# Patient Record
Sex: Male | Born: 1960 | Race: Black or African American | Hispanic: No | Marital: Single | Smoking: Never smoker
Health system: Southern US, Community
[De-identification: ages and names within clinical notes are randomized; demographics above are authoritative.]

## PROBLEM LIST (undated history)

## (undated) DIAGNOSIS — F32A Depression, unspecified: Secondary | ICD-10-CM

## (undated) DIAGNOSIS — F329 Major depressive disorder, single episode, unspecified: Secondary | ICD-10-CM

## (undated) DIAGNOSIS — E119 Type 2 diabetes mellitus without complications: Secondary | ICD-10-CM

## (undated) HISTORY — PX: ARTERY REPAIR: SHX559

---

## 2018-11-19 ENCOUNTER — Emergency Department (HOSPITAL_COMMUNITY)
Admission: EM | Admit: 2018-11-19 | Discharge: 2018-11-19 | Disposition: A | Discharge status: Home / Self Care | Payer: Medicaid Other | Attending: Emergency Medicine | Admitting: Emergency Medicine

## 2018-11-19 ENCOUNTER — Encounter (HOSPITAL_COMMUNITY): Payer: Self-pay | Admitting: Emergency Medicine

## 2018-11-19 ENCOUNTER — Other Ambulatory Visit: Payer: Self-pay

## 2018-11-19 DIAGNOSIS — Z760 Encounter for issue of repeat prescription: Secondary | ICD-10-CM | POA: Insufficient documentation

## 2018-11-19 DIAGNOSIS — E119 Type 2 diabetes mellitus without complications: Secondary | ICD-10-CM | POA: Insufficient documentation

## 2018-11-19 HISTORY — DX: Major depressive disorder, single episode, unspecified: F32.9

## 2018-11-19 HISTORY — DX: Depression, unspecified: F32.A

## 2018-11-19 HISTORY — DX: Type 2 diabetes mellitus without complications: E11.9

## 2018-11-19 LAB — CBG MONITORING, ED: Glucose-Capillary: 291 mg/dL — ABNORMAL HIGH (ref 70–99)

## 2018-11-19 MED ORDER — GLIPIZIDE 10 MG PO TABS: 10.0000 mg | ORAL_TABLET | Freq: Two times a day (BID) | ORAL | 0 refills | Status: DC

## 2018-11-19 NOTE — ED Triage Notes (Signed)
Pt reports being out of his glipizide since Thursday, he states "I don't have a primary care." He was seen last in [place] and given medication. Denies any other complaints.

## 2018-11-19 NOTE — Discharge Instructions (Signed)
Take glipizide 10 mg twice daily.   Call medicaid to be assigned to a primary care doctor   Return to ER if you have fever, abdominal pain, vomiting, dehydration.

## 2018-11-19 NOTE — ED Provider Notes (Signed)
[facility] EMERGENCY DEPARTMENT Provider Note   CSN: [id number] Arrival date & time: 11/19/18  1125    History   Chief Complaint Chief Complaint  Patient presents with  . Medication Refill    HPI Keith Valdez is a 58 y.o. male hx of DM, depression, here presenting with medication refill.  Patient states that he just moved down here from [place] and just obtained Medicaid.  He states that he does not have a primary care doctor currently.  He ran out of his glipizide several days ago.  He denies any fevers or chills or vomiting abdominal pain or trouble urinating.  Patient requests refill for his glipizide.  He states that he takes 10 mg twice daily.      The history is provided by the patient.    Past Medical History:  Diagnosis Date  . Depression   . Diabetes mellitus without complication (HCC)     There are no active problems to display for this patient.   History reviewed. No pertinent surgical history.      Home Medications    Prior to Admission medications   Not on File    Family History No family history on file.  Social History Social History   Tobacco Use  . Smoking status: Never Smoker  . Smokeless tobacco: Never Used  Substance Use Topics  . Alcohol use: Not Currently  . Drug use: Not Currently     Allergies   Patient has no known allergies.   Review of Systems Review of Systems  Constitutional: Negative for fatigue and fever.  Gastrointestinal: Negative for abdominal pain and vomiting.  All other systems reviewed and are negative.    Physical Exam Updated Vital Signs BP 139/89 (BP Location: Left Arm)   Pulse 89   Temp 98.3 F (36.8 C) (Oral)   Resp 18   SpO2 97%   Physical Exam Vitals signs and nursing note reviewed.  HENT:     Head: Normocephalic.     Mouth/Throat:     Mouth: Mucous membranes are moist.  Eyes:     Extraocular Movements: Extraocular movements intact.     Pupils: Pupils are equal,  round, and reactive to light.  Neck:     Musculoskeletal: Normal range of motion.  Cardiovascular:     Rate and Rhythm: Normal rate and regular rhythm.  Pulmonary:     Effort: Pulmonary effort is normal.     Breath sounds: Normal breath sounds.  Abdominal:     General: Abdomen is flat.     Palpations: Abdomen is soft.  Musculoskeletal: Normal range of motion.  Skin:    General: Skin is warm.     Capillary Refill: Capillary refill takes less than 2 seconds.  Neurological:     General: No focal deficit present.     Mental Status: He is alert and oriented to person, place, and time.  Psychiatric:        Mood and Affect: Mood normal.        Behavior: Behavior normal.      ED Treatments / Results  Labs (all labs ordered are listed, but only abnormal results are displayed) Labs Reviewed  CBG MONITORING, ED - Abnormal; Notable for the following components:      Result Value   Glucose-Capillary 291 (*)    All other components within normal limits    EKG None  Radiology No results found.  Procedures Procedures (including critical care time)  Medications Ordered in  ED Medications - No data to display   Initial Impression / Assessment and Plan / ED Course  I have reviewed the triage vital signs and the nursing notes.  Pertinent labs & imaging results that were available during my care of the patient were reviewed by me and considered in my medical decision making (see chart for details).       Keith Valdez is a 58 y.o. male here with medication refill. CBG is 291 in the ED. No vomiting or fever or vomiting. Will refill glipizide. Told him to call medicaid to get PCP. Stable for discharge.    Final Clinical Impressions(s) / ED Diagnoses   Final diagnoses:  None    ED Discharge Orders    None       Charlynne Pander, MD 11/19/18 1237

## 2018-12-21 DIAGNOSIS — Z125 Encounter for screening for malignant neoplasm of prostate: Secondary | ICD-10-CM | POA: Diagnosis not present

## 2018-12-21 DIAGNOSIS — Z13220 Encounter for screening for lipoid disorders: Secondary | ICD-10-CM | POA: Diagnosis not present

## 2018-12-21 DIAGNOSIS — E1165 Type 2 diabetes mellitus with hyperglycemia: Secondary | ICD-10-CM | POA: Diagnosis not present

## 2018-12-21 DIAGNOSIS — G589 Mononeuropathy, unspecified: Secondary | ICD-10-CM | POA: Diagnosis not present

## 2019-01-16 ENCOUNTER — Telehealth (HOSPITAL_COMMUNITY): Payer: Self-pay | Admitting: Rehabilitation

## 2019-01-16 NOTE — Telephone Encounter (Signed)
The above patient or their representative was contacted and gave the following answers to these questions:         Do you have any of the following symptoms? No  Fever                    Cough                   Shortness of breath  Do  you have any of the following other symptoms? No    muscle pain         vomiting,        diarrhea        rash         weakness        red eye        abdominal pain         bruising          bruising or bleeding              joint pain           severe headache    Have you been in contact with someone who was or has been sick in the past 2 weeks? No  Yes                 Unsure                         Unable to assess   Does the person that you were in contact with have any of the following symptoms?   Cough         shortness of breath           muscle pain         vomiting,            diarrhea            rash            weakness           fever            red eye           abdominal pain           bruising  or  bleeding                joint pain                severe headache               Have you  or someone you have been in contact with traveled internationally in th last month? No        If yes, which countries?   Have you  or someone you have been in contact with traveled outside [place] in th last month?  No       If yes, which state and city?   COMMENTS OR ACTION PLAN FOR THIS PATIENT:  Pt does not currently have a mask that he can wear for his appt

## 2019-01-17 ENCOUNTER — Other Ambulatory Visit: Payer: Self-pay

## 2019-01-17 ENCOUNTER — Other Ambulatory Visit (HOSPITAL_COMMUNITY): Payer: Self-pay | Admitting: Internal Medicine

## 2019-01-17 ENCOUNTER — Ambulatory Visit (HOSPITAL_COMMUNITY)
Admission: RE | Admit: 2019-01-17 | Discharge: 2019-01-17 | Disposition: A | Discharge status: Home / Self Care | Payer: Medicaid Other | Source: Ambulatory Visit | Attending: Family | Admitting: Family

## 2019-01-17 DIAGNOSIS — I739 Peripheral vascular disease, unspecified: Secondary | ICD-10-CM

## 2019-01-21 DIAGNOSIS — E0842 Diabetes mellitus due to underlying condition with diabetic polyneuropathy: Secondary | ICD-10-CM | POA: Diagnosis not present

## 2019-01-21 DIAGNOSIS — E1142 Type 2 diabetes mellitus with diabetic polyneuropathy: Secondary | ICD-10-CM | POA: Diagnosis not present

## 2019-03-04 DIAGNOSIS — G589 Mononeuropathy, unspecified: Secondary | ICD-10-CM | POA: Diagnosis not present

## 2019-03-04 DIAGNOSIS — E1142 Type 2 diabetes mellitus with diabetic polyneuropathy: Secondary | ICD-10-CM | POA: Diagnosis not present

## 2019-06-18 ENCOUNTER — Other Ambulatory Visit: Payer: Self-pay

## 2019-06-18 ENCOUNTER — Emergency Department (HOSPITAL_COMMUNITY)
Admission: EM | Admit: 2019-06-18 | Discharge: 2019-06-18 | Disposition: A | Discharge status: Home / Self Care | Payer: Medicaid Other | Attending: Emergency Medicine | Admitting: Emergency Medicine

## 2019-06-18 DIAGNOSIS — E119 Type 2 diabetes mellitus without complications: Secondary | ICD-10-CM | POA: Diagnosis not present

## 2019-06-18 DIAGNOSIS — M549 Dorsalgia, unspecified: Secondary | ICD-10-CM | POA: Insufficient documentation

## 2019-06-18 DIAGNOSIS — Z7984 Long term (current) use of oral hypoglycemic drugs: Secondary | ICD-10-CM | POA: Diagnosis not present

## 2019-06-18 MED ORDER — METHOCARBAMOL 500 MG PO TABS: 500.0000 mg | ORAL_TABLET | Freq: Two times a day (BID) | ORAL | 0 refills | Status: DC

## 2019-06-18 MED ORDER — MELOXICAM 15 MG PO TABS: 15.0000 mg | ORAL_TABLET | Freq: Every day | ORAL | 0 refills | Status: DC

## 2019-06-18 NOTE — ED Provider Notes (Signed)
[facility] EMERGENCY DEPARTMENT Provider Note   CSN: [id number] Arrival date & time: 06/18/19  1421     History   Chief Complaint Chief Complaint  Patient presents with  . Back Pain    HPI Devian Bartolomei is a 58 y.o. male.     The history is provided by the patient.  Back Pain Location:  Generalized Quality:  Aching Pain severity:  Moderate Pain is:  Same all the time Onset quality:  Gradual Timing:  Constant Progression:  Worsening Context: not recent illness   Relieved by:  Nothing Worsened by:  Nothing Ineffective treatments:  None tried Associated symptoms: no abdominal pain and no fever   Risk factors: no hx of cancer   Pt reports he is diabetic.  He has had neck pain for several years.  Pt reports increasing pain.  He thinks it may be from his diabetes.  Pt is not currently taking any medications.    Past Medical History:  Diagnosis Date  . Depression   . Diabetes mellitus without complication ([place])     There are no active problems to display for this patient.   No past surgical history on file.      Home Medications    Prior to Admission medications   Medication Sig Start Date End Date Taking? Authorizing Provider  glipiZIDE (GLUCOTROL) 10 MG tablet Take 1 tablet (10 mg total) by mouth 2 (two) times daily before a meal. 11/19/18   Drenda Freeze, MD    Family History No family history on file.  Social History Social History   Tobacco Use  . Smoking status: Never Smoker  . Smokeless tobacco: Never Used  Substance Use Topics  . Alcohol use: Not Currently  . Drug use: Not Currently     Allergies   Patient has no known allergies.   Review of Systems Review of Systems  Constitutional: Negative for fever.  Gastrointestinal: Negative for abdominal pain.  Musculoskeletal: Positive for back pain.  All other systems reviewed and are negative.    Physical Exam Updated Vital Signs BP (!) 130/99 (BP Location: Right  Arm)   Pulse 93   Temp 98.5 F (36.9 C) (Oral)   Resp 14   SpO2 99%   Physical Exam Vitals signs and nursing note reviewed.  Constitutional:      Appearance: He is well-developed.  HENT:     Head: Normocephalic and atraumatic.  Eyes:     Conjunctiva/sclera: Conjunctivae normal.  Neck:     Musculoskeletal: Neck supple.  Cardiovascular:     Rate and Rhythm: Normal rate and regular rhythm.     Heart sounds: No murmur.  Pulmonary:     Effort: Pulmonary effort is normal. No respiratory distress.     Breath sounds: Normal breath sounds.  Abdominal:     Palpations: Abdomen is soft.     Tenderness: There is no abdominal tenderness.  Musculoskeletal:        General: Tenderness present.     Comments: Tender cervical and thoracic spine diffusely   Skin:    General: Skin is warm and dry.  Neurological:     General: No focal deficit present.     Mental Status: He is alert.  Psychiatric:        Mood and Affect: Mood normal.      ED Treatments / Results  Labs (all labs ordered are listed, but only abnormal results are displayed) Labs Reviewed - No data to display  EKG  None  Radiology No results found.  Procedures Procedures (including critical care time)  Medications Ordered in ED Medications - No data to display   Initial Impression / Assessment and Plan / ED Course  I have reviewed the triage vital signs and the nursing notes.  Pertinent labs & imaging results that were available during my care of the patient were reviewed by me and considered in my medical decision making (see chart for details).        Pt is having trouble finding a primary care MD.  I will have care management see pt  Pt scheduled with wellness clinic.   Final Clinical Impressions(s) / ED Diagnoses   Final diagnoses:  Acute back pain, unspecified back location, unspecified back pain laterality  Type 2 diabetes mellitus without complication, without long-term current use of insulin [facility])     ED Discharge Orders         Ordered    meloxicam (MOBIC) 15 MG tablet  Daily     06/18/19 1658    methocarbamol (ROBAXIN) 500 MG tablet  2 times daily     06/18/19 1658        An After Visit Summary was printed and given to the patient.    Elson Areas, PA-C 06/18/19 1659    Melene Plan, DO 06/18/19 1715

## 2019-06-18 NOTE — ED Triage Notes (Signed)
Pt here for neck and upper and mid back pain x 1 month. Taking OTC meds without relief. Pt sts he hurt his back 2 years ago but in the last month it has been continuous. Pt sts he has constant dull pain but is worse with a shrugging motion. Pt ambulatory.

## 2019-07-17 ENCOUNTER — Ambulatory Visit: Payer: Medicaid Other | Attending: Family Medicine | Admitting: Family Medicine

## 2019-07-17 ENCOUNTER — Other Ambulatory Visit: Payer: Self-pay

## 2019-07-17 ENCOUNTER — Encounter: Payer: Self-pay | Admitting: Family Medicine

## 2019-07-17 DIAGNOSIS — M549 Dorsalgia, unspecified: Secondary | ICD-10-CM

## 2019-07-17 DIAGNOSIS — G8929 Other chronic pain: Secondary | ICD-10-CM | POA: Diagnosis not present

## 2019-07-17 DIAGNOSIS — E1142 Type 2 diabetes mellitus with diabetic polyneuropathy: Secondary | ICD-10-CM | POA: Diagnosis not present

## 2019-07-17 DIAGNOSIS — M5416 Radiculopathy, lumbar region: Secondary | ICD-10-CM | POA: Diagnosis not present
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
## 2019-07-17 NOTE — Progress Notes (Signed)
Patient verified DOB Patient has not eaten today. Patient has taken medication today. Patient complains of pain scaled currently at a 10+ in upper back and neck. Patient states pain has been present for 2 months.

## 2019-07-19 ENCOUNTER — Other Ambulatory Visit: Payer: Medicaid Other

## 2019-07-24 ENCOUNTER — Ambulatory Visit (INDEPENDENT_AMBULATORY_CARE_PROVIDER_SITE_OTHER): Payer: Medicaid Other | Admitting: Family Medicine

## 2019-07-24 ENCOUNTER — Encounter: Payer: Self-pay | Admitting: Family Medicine

## 2019-07-24 ENCOUNTER — Other Ambulatory Visit: Payer: Self-pay

## 2019-07-24 DIAGNOSIS — M546 Pain in thoracic spine: Secondary | ICD-10-CM

## 2019-07-24 DIAGNOSIS — R200 Anesthesia of skin: Secondary | ICD-10-CM | POA: Diagnosis not present

## 2019-07-24 DIAGNOSIS — M542 Cervicalgia: Secondary | ICD-10-CM | POA: Diagnosis not present

## 2019-07-24 DIAGNOSIS — R202 Paresthesia of skin: Secondary | ICD-10-CM | POA: Diagnosis not present

## 2019-07-24 MED ORDER — BACLOFEN 10 MG PO TABS: 5.0000 mg | ORAL_TABLET | Freq: Three times a day (TID) | ORAL | 3 refills | Status: DC | PRN

## 2019-07-24 MED ORDER — NABUMETONE 750 MG PO TABS: 750.0000 mg | ORAL_TABLET | Freq: Two times a day (BID) | ORAL | 6 refills | Status: AC | PRN
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
## 2019-07-25 ENCOUNTER — Other Ambulatory Visit: Payer: Self-pay | Admitting: Family Medicine

## 2019-07-25 MED ORDER — CELECOXIB 200 MG PO CAPS: 200.0000 mg | ORAL_CAPSULE | Freq: Two times a day (BID) | ORAL | 3 refills | Status: DC | PRN

## 2019-07-29 ENCOUNTER — Other Ambulatory Visit: Payer: Self-pay | Admitting: Family Medicine

## 2019-08-13 ENCOUNTER — Ambulatory Visit: Payer: Medicaid Other | Attending: Family Medicine

## 2019-08-13 ENCOUNTER — Other Ambulatory Visit: Payer: Self-pay

## 2019-08-13 DIAGNOSIS — G8929 Other chronic pain: Secondary | ICD-10-CM | POA: Insufficient documentation

## 2019-08-13 DIAGNOSIS — M546 Pain in thoracic spine: Secondary | ICD-10-CM | POA: Diagnosis not present

## 2019-08-13 DIAGNOSIS — M542 Cervicalgia: Secondary | ICD-10-CM | POA: Diagnosis not present

## 2019-08-13 DIAGNOSIS — M62838 Other muscle spasm: Secondary | ICD-10-CM | POA: Diagnosis not present

## 2019-08-13 DIAGNOSIS — M436 Torticollis: Secondary | ICD-10-CM | POA: Insufficient documentation

## 2019-08-13 DIAGNOSIS — R293 Abnormal posture: Secondary | ICD-10-CM | POA: Diagnosis not present

## 2019-08-13 NOTE — Therapy (Signed)
[facility] [address] [place], [place], [address] Phone: [phone]   Fax:  [phone]  Physical Therapy Evaluation  Patient Details  Name: Keith Valdez MRN: [id number] Date of Birth: [birth year] Referring Provider (PT): Lavada Mesi, MD   Encounter Date: 08/13/2019  PT End of Session - 08/13/19 1342    Visit Number  1    Number of Visits  12    Date for PT Re-Evaluation  09/27/19    Authorization Type  MCD    PT Start Time  0140    PT Stop Time  0220    PT Time Calculation (min)  40 min    Activity Tolerance  Patient tolerated treatment well;Patient limited by pain    Behavior During Therapy  [facility] for tasks assessed/performed       Past Medical History:  Diagnosis Date  . Depression   . Diabetes mellitus without complication (HCC)     History reviewed. No pertinent surgical history.  There were no vitals filed for this visit.   Subjective Assessment - 08/13/19 1350    Subjective  He reports  neck pain that has been going on for 3 months.  He reports no injury.   He works doing Location manager.   REaching up with arm pain starts to increase.  Takes meds to go to sleep.    Pertinent History  Previous injury  with same symptoms.    Limitations  Lifting;Standing;Sitting    Diagnostic tests  None    Patient Stated Goals  He wants to  decrease pain.    Currently in Pain?  Yes    Pain Score  --   unable to state a  number   Pain Location  Neck    Pain Orientation  Right;Left;Posterior    Pain Descriptors / Indicators  Dull;Aching    Pain Type  Chronic pain    Pain Onset  More than a month ago    Pain Frequency  Constant    Aggravating Factors   Reaching,   movement.    Pain Relieving Factors  bend foreward,     hot shower , meds         [facility] PT Assessment - 08/13/19 0001      Assessment   Medical Diagnosis  cervical and thoracic pain    Referring Provider (PT)  Lavada Mesi, MD    Onset Date/Surgical  Date  --   3 months or more   Next MD Visit  after PT    Prior Therapy  no      Precautions   Precautions  None      Restrictions   Weight Bearing Restrictions  No      Balance Screen   Has the patient fallen in the past 6 months  No      Prior Function   Level of Independence  Independent    Vocation  Full time employment    Dispensing optician      Cognition   Overall Cognitive Status  Within Functional Limits for tasks assessed      Posture/Postural Control   Posture Comments  forward head  rounded shoulders      ROM / Strength   AROM / PROM / Strength  AROM;PROM;Strength      AROM   AROM Assessment Site  Cervical;Shoulder    Right/Left Shoulder  Right;Left    Right Shoulder Flexion  80 Degrees    Left Shoulder Flexion  80 Degrees    Cervical Flexion  30    Cervical Extension  20    Cervical - Right Side Bend  12    Cervical - Left Side Bend  20    Cervical - Right Rotation  45    Cervical - Left Rotation  42      Strength   Overall Strength Comments  UE WNL but with some pain on shoulder testing                Objective measurements completed on examination: See above findings.              PT Education - 08/13/19 1342    Education Details  POC, HEP    Person(s) Educated  Patient    Methods  Explanation;Demonstration;Tactile cues;Verbal cues;Handout    Comprehension  Verbalized understanding;Returned demonstration       PT Short Term Goals - 08/13/19 1345      PT SHORT TERM GOAL #1   Title  He will be independent with initial HEp    Baseline  no program    Time  2    Period  Weeks    Status  New      PT SHORT TERM GOAL #2   Title  He will demo understanding of good  posture    Baseline  forward head  , rounded shoulder    Time  2    Period  Weeks    Status  New      PT SHORT TERM GOAL #3   Title  10% decreased in pain    Time  2    Period  Weeks    Status  New        PT Long Term Goals -  08/13/19 1432      PT LONG TERM GOAL #1   Title  He will be indpendent with all hEP issued    Baseline  Independent with initial HEP    Time  6    Period  Weeks    Status  New      PT LONG TERM GOAL #2   Title  He will report pain as intermittant with work and home tasks    Baseline  pain increaes with use of arms at work and home    Time  6    Period  Weeks    Status  New      PT LONG TERM GOAL #3   Title  He will return to normal sleep without medication need    Baseline  uses meds prior to sleep    Time  6    Period  Weeks    Status  New      PT LONG TERM GOAL #4   Title  shoulder motion will be WNL bilaterally with no pain    Baseline  pain with movenemtn of arms to shoulder height    Time  6    Period  Weeks    Status  New             Plan - 08/13/19 1343    Clinical Impression Statement  Mr Leighton RoachCalloway reports onset of neck and upper back pain. He relate this to  physical labor.   He had previous episode  symptoms like this. He really needs to move more and stretch the tightness and improve his posture.   His arm ROm is normal passive but he is reluctant to move  passt  80 degree selevation.   He should improve with skilled PT and  consistent HEP .    Personal Factors and Comorbidities  Past/Current Experience    Examination-Activity Limitations  Lift;Stand;Sleep;Sit;Reach Overhead    Examination-Participation Restrictions  Yard Work;Community Activity   work activity   Stability/Clinical Decision Making  Stable/Uncomplicated    Clinical Decision Making  Low    Rehab Potential  Good    PT Frequency  --   3 visits   PT Duration  2 weeks   then 2x/week for 4 weeks   PT Treatment/Interventions  Dry needling;Manual techniques;Patient/family education;Therapeutic activities;Therapeutic exercise;Electrical Stimulation;Traction;Moist Heat;Iontophoresis 4mg /ml Dexamethasone;Ultrasound;Passive range of motion;Taping    PT Next Visit Plan  Establish HEP, manual and  modalities    PT Home Exercise Plan  shoulder flexion up wall and cervical retraction and rotation    Consulted and Agree with Plan of Care  Patient       Patient will benefit from skilled therapeutic intervention in order to improve the following deficits and impairments:  Postural dysfunction, Increased muscle spasms, Pain, Decreased activity tolerance, Decreased range of motion  Visit Diagnosis: Cervicalgia  Chronic bilateral thoracic back pain  Stiffness of neck  Other muscle spasm  Abnormal posture     Problem List There are no active problems to display for this patient.   Darrel Hoover  PT 08/13/2019, 2:35 PM  [facility] [address] [place], [place], [address] Phone: [phone]   Fax:  [phone]  Name: Alexandria Current MRN: [id number] Date of Birth: 09/18/1960

## 2019-08-13 NOTE — Patient Instructions (Signed)
Wall slides for shoulders.  Cervical rotation with retraction.  2 reps 3-4x/day

## 2019-08-21 ENCOUNTER — Encounter: Payer: Self-pay | Admitting: Physical Therapy

## 2019-08-21 ENCOUNTER — Other Ambulatory Visit: Payer: Self-pay

## 2019-08-21 ENCOUNTER — Ambulatory Visit: Payer: Medicaid Other | Admitting: Physical Therapy

## 2019-08-21 DIAGNOSIS — M436 Torticollis: Secondary | ICD-10-CM | POA: Diagnosis not present

## 2019-08-21 DIAGNOSIS — G8929 Other chronic pain: Secondary | ICD-10-CM

## 2019-08-21 DIAGNOSIS — M542 Cervicalgia: Secondary | ICD-10-CM | POA: Diagnosis not present

## 2019-08-21 DIAGNOSIS — R293 Abnormal posture: Secondary | ICD-10-CM

## 2019-08-21 DIAGNOSIS — M546 Pain in thoracic spine: Secondary | ICD-10-CM | POA: Diagnosis not present

## 2019-08-21 DIAGNOSIS — M62838 Other muscle spasm: Secondary | ICD-10-CM

## 2019-08-21 NOTE — Therapy (Signed)
[facility] [address] [place], [place], [address] Phone: [phone]   Fax:  [phone]  Physical Therapy Treatment  Patient Details  Name: Keith Valdez MRN: [id number] Date of Birth: [birth year] Referring Provider (PT): Lavada Mesi, MD   Encounter Date: 08/21/2019  PT End of Session - 08/21/19 1201    Visit Number  2    Number of Visits  12    Date for PT Re-Evaluation  09/27/19    Authorization Type  MCD    Authorization Time Period  08/20/19-09/02/19    Authorization - Visit Number  1    Authorization - Number of Visits  3    PT Start Time  1139    PT Stop Time  1232    PT Time Calculation (min)  53 min       Past Medical History:  Diagnosis Date  . Depression   . Diabetes mellitus without complication (HCC)     History reviewed. No pertinent surgical history.  There were no vitals filed for this visit.  Subjective Assessment - 08/21/19 1201    Subjective  Constant dull pain that moves from shoulder to neck to thoracic. He is unable to rate pain. Dull and constant.    Currently in Pain?  --   unable to rate                      [facility] Adult PT Treatment/Exercise - 08/21/19 0001      Neck Exercises: Supine   Neck Retraction  5 reps    Other Supine Exercise  scpa retract x 10     Other Supine Exercise  supine chest press with cues to keep shoulders stable, pullovers- able to work into flexion overhead       Modalities   Modalities  Moist Heat      Moist Heat Therapy   Number Minutes Moist Heat  15 Minutes    Moist Heat Location  Cervical      Neck Exercises: Stretches   Upper Trapezius Stretch Limitations  avoided due to popping    Levator Stretch Limitations  gentle active levator stretching    Other Neck Stretches  active cervical rotation               PT Short Term Goals - 08/13/19 1345      PT SHORT TERM GOAL #1   Title  He will be independent with initial HEp    Baseline  no program    Time  2    Period  Weeks    Status  New      PT SHORT TERM GOAL #2   Title  He will demo understanding of good  posture    Baseline  forward head  , rounded shoulder    Time  2    Period  Weeks    Status  New      PT SHORT TERM GOAL #3   Title  10% decreased in pain    Time  2    Period  Weeks    Status  New        PT Long Term Goals - 08/13/19 1432      PT LONG TERM GOAL #1   Title  He will be indpendent with all hEP issued    Baseline  Independent with initial HEP    Time  6    Period  Weeks    Status  New  PT LONG TERM GOAL #2   Title  He will report pain as intermittant with work and home tasks    Baseline  pain increaes with use of arms at work and home    Time  6    Period  Weeks    Status  New      PT LONG TERM GOAL #3   Title  He will return to normal sleep without medication need    Baseline  uses meds prior to sleep    Time  6    Period  Weeks    Status  New      PT LONG TERM GOAL #4   Title  shoulder motion will be WNL bilaterally with no pain    Baseline  pain with movenemtn of arms to shoulder height    Time  6    Period  Weeks    Status  New            Plan - 08/21/19 1226    Clinical Impression Statement  Mr Parish reports constant pain that is dull in nature and constant. It does move from right shoulder to upper trap to upper back. Reviewed posture and HEP. He constantly pops himself and reports relief with this. Continued with gentl nect stretching and postural exercises. He was able to increased his supne shoulder flexion using pullovers with wooden dowel. After therex he reported increased pain. He did report that stretching UEs flet better afterward. Discussed benefit of IFC for pain however he declined to try it today. He did try HMP.    PT Next Visit Plan  Establish HEP, manual and modalities, assess HMP, he might try IFC next session    PT Home Exercise Plan  shoulder flexion up wall and cervical  retraction and rotation       Patient will benefit from skilled therapeutic intervention in order to improve the following deficits and impairments:  Postural dysfunction, Increased muscle spasms, Pain, Decreased activity tolerance, Decreased range of motion  Visit Diagnosis: Cervicalgia  Chronic bilateral thoracic back pain  Stiffness of neck  Other muscle spasm  Abnormal posture     Problem List There are no active problems to display for this patient.   Dorene Ar, [place] 08/21/2019, 12:31 PM  [facility] [address] [place], [place], [address] Phone: [phone]   Fax:  [phone]  Name: Daymen Hassebrock MRN: [id number] Date of Birth: June 24, 1961

## 2019-08-26 ENCOUNTER — Telehealth: Payer: Self-pay | Admitting: Physical Therapy

## 2019-08-26 ENCOUNTER — Ambulatory Visit: Payer: Medicaid Other | Admitting: Physical Therapy

## 2019-08-26 NOTE — Telephone Encounter (Signed)
Attempted to contact patient regarding no-show to appointment today. Voice mail full, so unable to leave a message.

## 2019-08-28 ENCOUNTER — Ambulatory Visit: Payer: Medicaid Other | Admitting: Physical Therapy

## 2019-09-10 ENCOUNTER — Other Ambulatory Visit: Payer: Self-pay

## 2019-09-10 ENCOUNTER — Encounter: Payer: Self-pay | Admitting: Physical Therapy

## 2019-09-10 ENCOUNTER — Ambulatory Visit: Payer: Medicaid Other

## 2019-09-10 DIAGNOSIS — G8929 Other chronic pain: Secondary | ICD-10-CM

## 2019-09-10 DIAGNOSIS — M542 Cervicalgia: Secondary | ICD-10-CM

## 2019-09-10 DIAGNOSIS — M546 Pain in thoracic spine: Secondary | ICD-10-CM | POA: Diagnosis not present

## 2019-09-10 DIAGNOSIS — R293 Abnormal posture: Secondary | ICD-10-CM | POA: Diagnosis not present

## 2019-09-10 DIAGNOSIS — M62838 Other muscle spasm: Secondary | ICD-10-CM | POA: Diagnosis not present

## 2019-09-10 DIAGNOSIS — M436 Torticollis: Secondary | ICD-10-CM

## 2019-09-10 NOTE — Therapy (Signed)
[place], [place], [address] Phone: [phone]   Fax:  [phone]  Physical Therapy Treatment  Patient Details  Name: Shandon Burlingame MRN: [id number] Date of Birth: 06-28-1961 Referring Provider (PT): Eunice Blase, MD   Encounter Date: 09/10/2019  PT End of Session - 09/10/19 1308    Visit Number  3    Number of Visits  12    Date for PT Re-Evaluation  10/18/19    Authorization Type  MCD used one of three authorized visits    Authorization Time Period  requesting additional visits    PT Start Time  1255    PT Stop Time  1333    PT Time Calculation (min)  38 min    Activity Tolerance  Patient tolerated treatment well;Patient limited by pain    Behavior During Therapy  [facility] for tasks assessed/performed       Past Medical History:  Diagnosis Date  . Depression   . Diabetes mellitus without complication ([facility])     History reviewed. No pertinent surgical history.  There were no vitals filed for this visit.  Subjective Assessment - 09/10/19 1257    Subjective  I think it is the neuropathy in my legs and hands they stay numb. The pain is located in my right upper back and neck.    Currently in Pain?  Yes    Pain Score  8     Pain Location  Thoracic    Pain Orientation  Right;Upper    Pain Descriptors / Indicators  Aching;Dull    Aggravating Factors   walking, going somewhere    Pain Relieving Factors  bend foreward, hot shower         [facility] PT Assessment - 09/10/19 0001      AROM   Right Shoulder Flexion  105 Degrees    Left Shoulder Flexion  105 Degrees    Cervical Flexion  30    Cervical Extension  20    Cervical - Right Side Bend  22    Cervical - Left Side Bend  20    Cervical - Right Rotation  50    Cervical - Left Rotation  46      PROM   Overall PROM Comments  130 Right PROM flexion, 135 Left PROM flexion                   [facility] Adult PT Treatment/Exercise -  09/10/19 0001      Neck Exercises: Standing   Other Standing Exercises  bilateral wall slides shoulder flexion x 10     Other Standing Exercises  back against wall , W backs x 5       Neck Exercises: Seated   Neck Retraction  10 reps      Neck Exercises: Supine   Neck Retraction  --    Other Supine Exercise  horizontal abduction  yellow band x 10     Other Supine Exercise  supine pullovers with dowel  x 10              PT Education - 09/10/19 1334    Education Details  HEP    Person(s) Educated  Patient    Methods  Explanation;Handout    Comprehension  Verbalized understanding       PT Short Term Goals - 09/10/19 1331      PT SHORT TERM GOAL #1   Title  He will be independent with initial  HEp    Baseline  independent with some of HEP    Time  2    Period  Weeks    Status  Partially Met      PT SHORT TERM GOAL #2   Title  He will demo understanding of good  posture    Baseline  He reports being more mindful of posture. Needs cues in clinic    Time  2    Period  Weeks    Status  On-going      PT SHORT TERM GOAL #3   Title  10% decreased in pain    Baseline  pain unchanged from baseline    Time  2    Period  Weeks    Status  On-going        PT Long Term Goals - 08/13/19 1432      PT LONG TERM GOAL #1   Title  He will be indpendent with all hEP issued    Baseline  Independent with initial HEP    Time  6    Period  Weeks    Status  New      PT LONG TERM GOAL #2   Title  He will report pain as intermittant with work and home tasks    Baseline  pain increaes with use of arms at work and home    Time  6    Period  Weeks    Status  New      PT LONG TERM GOAL #3   Title  He will return to normal sleep without medication need    Baseline  uses meds prior to sleep    Time  6    Period  Weeks    Status  New      PT LONG TERM GOAL #4   Title  shoulder motion will be WNL bilaterally with no pain    Baseline  pain with movenemtn of arms to shoulder  height    Time  6    Period  Weeks    Status  New            Plan - 09/10/19 1308    Clinical Impression Statement  Patient arrives after missing two scheduled appointmnets due to moving. He reports short term improvement in mobility after exercises however the exercises also increase his pain. He has improved his AROM of shoulder flexion and cervical rotation and right side bend. he is performing some of his HEP. He reports being more mindful of posture in sitting and standing.JD         I avised that he should improve if he would work hard at improving his ROM in hsoulders and neck. He was given the option to stop PT ot continue and he reported he would like to try 6-8 more sessions as he has had some gains.    PT Frequency  2x / week    PT Duration  4 weeks    PT Treatment/Interventions  Dry needling;Manual techniques;Patient/family education;Therapeutic activities;Therapeutic exercise;Electrical Stimulation;Traction;Moist Heat;Iontophoresis '4mg'$ /ml Dexamethasone;Ultrasound;Passive range of motion;Taping    PT Next Visit Plan  Establish HEP, manual and modalities, assess HMP, he might try IFC next session, continue to push shoulder and neck ROM   , trial of traction    PT Home Exercise Plan  shoulder flexion up wall and cervical retraction and rotation, added LTR and yellow band horizontal abduction       Patient will benefit from skilled therapeutic intervention  in order to improve the following deficits and impairments:  Postural dysfunction, Increased muscle spasms, Pain, Decreased activity tolerance, Decreased range of motion  Visit Diagnosis: Cervicalgia  Chronic bilateral thoracic back pain  Stiffness of neck  Other muscle spasm  Abnormal posture     Problem List There are no problems to display for this patient.   Darrel Hoover  PT 09/10/2019, 1:44 PM  [facility] [address] [place], [place],  [address] Phone: [phone]   Fax:  [phone]  Name: Jettson Crable MRN: [id number] Date of Birth: [birth year]

## 2019-09-17 ENCOUNTER — Ambulatory Visit: Payer: Medicaid Other | Attending: Family Medicine

## 2019-09-17 ENCOUNTER — Other Ambulatory Visit: Payer: Self-pay

## 2019-09-17 DIAGNOSIS — M62838 Other muscle spasm: Secondary | ICD-10-CM | POA: Diagnosis not present

## 2019-09-17 DIAGNOSIS — G8929 Other chronic pain: Secondary | ICD-10-CM

## 2019-09-17 DIAGNOSIS — R293 Abnormal posture: Secondary | ICD-10-CM | POA: Diagnosis not present

## 2019-09-17 DIAGNOSIS — M542 Cervicalgia: Secondary | ICD-10-CM | POA: Diagnosis not present

## 2019-09-17 DIAGNOSIS — M436 Torticollis: Secondary | ICD-10-CM | POA: Insufficient documentation

## 2019-09-17 DIAGNOSIS — M546 Pain in thoracic spine: Secondary | ICD-10-CM | POA: Diagnosis not present

## 2019-09-17 NOTE — Patient Instructions (Signed)
Thoracic extension stretching over chair back 3-5 reps 2x/day,  Cross body stretch with rotation.   , supine deep breathing with arm in W or hor abduction.  3-5 reps  2x/day

## 2019-09-17 NOTE — Therapy (Signed)
[place], [place], [address] Phone: [phone]   Fax:  [phone]  Physical Therapy Treatment  Patient Details  Name: Keith Valdez MRN: [id number] Date of Birth: [birth year] Referring Provider (PT): Eunice Blase, MD   Encounter Date: 09/17/2019  PT End of Session - 09/17/19 1048    Visit Number  4    Number of Visits  12    Date for PT Re-Evaluation  10/18/19    Authorization Type  MCD used one of three authorized visits    Authorization Time Period  1-4/21  to 10/06/19    Authorization - Visit Number  1    Authorization - Number of Visits  3    PT Start Time  2376    PT Stop Time  1138    PT Time Calculation (min)  53 min    Activity Tolerance  Patient tolerated treatment well;Patient limited by pain    Behavior During Therapy  [facility] for tasks assessed/performed       Past Medical History:  Diagnosis Date  . Depression   . Diabetes mellitus without complication ([place])     History reviewed. No pertinent surgical history.  There were no vitals filed for this visit.  Subjective Assessment - 09/17/19 1050    Subjective  He is some better with HEP.    Currently in Pain?  Yes    Pain Score  7     Pain Location  Thoracic    Pain Orientation  Right;Left;Upper    Pain Descriptors / Indicators  Aching;Dull    Pain Type  Chronic pain    Pain Onset  More than a month ago    Pain Frequency  Constant    Aggravating Factors   activity    Pain Relieving Factors  bending , heat , stretching                       [facility] Adult PT Treatment/Exercise - 09/17/19 0001      Neck Exercises: Seated   Neck Retraction  10 reps    W Back  5 reps    W Back Limitations  stretch with ext over back chair    Shoulder ABduction  Both;10 reps    Shoulder Abduction Limitations  yellow band horizontal and ER  and flexion to  90 degrees with yellow band pull.     Other Seated Exercise  cross chest shoulder  stretch with trunk rotation. , supine thoracici ext with deep breathing exerciss      Moist Heat Therapy   Number Minutes Moist Heat  15 Minutes    Moist Heat Location  Cervical   thoracic      Manual Therapy   Manual Therapy  Soft tissue mobilization;Manual Traction;Passive ROM;Joint mobilization    Joint Mobilization  thoracici and cervial Gr 2-3 PA glides central and lateral    Soft tissue mobilization  cervical and thoracic paraspinals    Passive ROM  neck rotation and sidebending    Manual Traction  cervical             PT Education - 09/17/19 1128    Education Details  HEP    Person(s) Educated  Patient    Methods  Explanation;Demonstration;Tactile cues;Verbal cues;Handout    Comprehension  Returned demonstration;Verbalized understanding       PT Short Term Goals - 09/10/19 1331      PT SHORT TERM GOAL #1   Title  He will be independent with initial HEp    Baseline  independent with some of HEP    Time  2    Period  Weeks    Status  Partially Met      PT SHORT TERM GOAL #2   Title  He will demo understanding of good  posture    Baseline  He reports being more mindful of posture. Needs cues in clinic    Time  2    Period  Weeks    Status  On-going      PT SHORT TERM GOAL #3   Title  10% decreased in pain    Baseline  pain unchanged from baseline    Time  2    Period  Weeks    Status  On-going        PT Long Term Goals - 08/13/19 1432      PT LONG TERM GOAL #1   Title  He will be indpendent with all hEP issued    Baseline  Independent with initial HEP    Time  6    Period  Weeks    Status  New      PT LONG TERM GOAL #2   Title  He will report pain as intermittant with work and home tasks    Baseline  pain increaes with use of arms at work and home    Time  6    Period  Weeks    Status  New      PT LONG TERM GOAL #3   Title  He will return to normal sleep without medication need    Baseline  uses meds prior to sleep    Time  6    Period   Weeks    Status  New      PT LONG TERM GOAL #4   Title  shoulder motion will be WNL bilaterally with no pain    Baseline  pain with movenemtn of arms to shoulder height    Time  6    Period  Weeks    Status  New            Plan - 09/17/19 1049    Clinical Impression Statement  Some decreased Lt hand numbenss post. He is more cooperative with exercise. Continue with popping that I do not think is something to avoid.    Add to red band next time    PT Treatment/Interventions  Dry needling;Manual techniques;Patient/family education;Therapeutic activities;Therapeutic exercise;Electrical Stimulation;Traction;Moist Heat;Iontophoresis 66m/ml Dexamethasone;Ultrasound;Passive range of motion;Taping    PT Next Visit Plan  Progress  to red band HEP, manual and modalities, assess HMP, he might try IFC next session, continue to push shoulder and neck ROM   , trial of traction    PT Home Exercise Plan  shoulder flexion up wall and cervical retraction and rotation, added LTR and yellow band horizontal abduction, thoracic rotation with cross body shoulder stretch, supine toracic extension with deep breathing , over shir back thoracic ext.    Consulted and Agree with Plan of Care  Patient       Patient will benefit from skilled therapeutic intervention in order to improve the following deficits and impairments:  Postural dysfunction, Increased muscle spasms, Pain, Decreased activity tolerance, Decreased range of motion  Visit Diagnosis: Cervicalgia  Chronic bilateral thoracic back pain  Stiffness of neck  Other muscle spasm  Abnormal posture     Problem List There are no problems to display  for this patient.   Darrel Hoover   PT 09/17/2019, 11:31 AM  [facility] [address] [place], [place], [address] Phone: [phone]   Fax:  [phone]  Name: Khole Branch MRN: [id number] Date of Birth: 04-25-1961

## 2019-09-19 ENCOUNTER — Ambulatory Visit: Payer: Medicaid Other

## 2019-09-19 ENCOUNTER — Other Ambulatory Visit: Payer: Self-pay

## 2019-09-19 DIAGNOSIS — M436 Torticollis: Secondary | ICD-10-CM

## 2019-09-19 DIAGNOSIS — G8929 Other chronic pain: Secondary | ICD-10-CM

## 2019-09-19 DIAGNOSIS — M542 Cervicalgia: Secondary | ICD-10-CM | POA: Diagnosis not present

## 2019-09-19 DIAGNOSIS — R293 Abnormal posture: Secondary | ICD-10-CM | POA: Diagnosis not present

## 2019-09-19 DIAGNOSIS — M62838 Other muscle spasm: Secondary | ICD-10-CM

## 2019-09-19 DIAGNOSIS — M546 Pain in thoracic spine: Secondary | ICD-10-CM

## 2019-09-19 NOTE — Therapy (Signed)
[place], [place], [address] Phone: [phone]   Fax:  [phone]  Physical Therapy Treatment  Patient Details  Name: Keith Valdez MRN: [id number] Date of Birth: [birth year] Referring Provider (PT): Eunice Blase, MD   Encounter Date: 09/19/2019  PT End of Session - 09/19/19 1157    Visit Number  5    Number of Visits  12    Date for PT Re-Evaluation  10/18/19    Authorization Type  MCD used one of three authorized visits    Authorization - Visit Number  2    Authorization - Number of Visits  3    PT Start Time  1155    PT Stop Time  1245    PT Time Calculation (min)  50 min    Activity Tolerance  Patient tolerated treatment well;Patient limited by pain    Behavior During Therapy  [facility] for tasks assessed/performed       Past Medical History:  Diagnosis Date  . Depression   . Diabetes mellitus without complication ([place])     No past surgical history on file.  There were no vitals filed for this visit.  Subjective Assessment - 09/19/19 1203    Subjective  Doing about the same.                       [facility] Adult PT Treatment/Exercise - 09/19/19 0001      Neck Exercises: Standing   Other Standing Exercises  bilateral wall slides shoulder flexion x 10     Other Standing Exercises  back against wall , W backs x 10      Neck Exercises: Seated   Neck Retraction  10 reps    Neck Retraction Limitations  back at wall standing    W Back  5 reps    W Back Limitations  stretch with ext over back chair    Shoulder ABduction  15 reps    Shoulder Abduction Limitations  red band    Other Seated Exercise  cross chest shoulder stretch with trunk rotation. , supine thoracici ext with deep breathing exerciss    Other Seated Exercise  ER red band x 15       Neck Exercises: Supine   Other Supine Exercise  horizontal abduction  red band x 10     Other Supine Exercise  supine pullovers with  pull on red band x 10       Shoulder Exercises: ROM/Strengthening   UBE (Upper Arm Bike)  4 min L1       Moist Heat Therapy   Number Minutes Moist Heat  12 Minutes    Moist Heat Location  Cervical   thoracic     Manual Therapy   Joint Mobilization  thoracici and cervial Gr 2-3 PA glides central and lateral    Soft tissue mobilization  cervical and thoracic paraspinals    Passive ROM  neck rotation and sidebending    Manual Traction  cervical               PT Short Term Goals - 09/19/19 1239      PT SHORT TERM GOAL #1   Title  He will be independent with initial HEp    Status  Achieved      PT SHORT TERM GOAL #2   Title  He will demo understanding of good  posture    Baseline  He reports being more mindful  of posture. Needs cues in clinic    Status  Partially Met      PT SHORT TERM GOAL #3   Title  10% decreased in pain    Status  Achieved        PT Long Term Goals - 08/13/19 1432      PT LONG TERM GOAL #1   Title  He will be indpendent with all hEP issued    Baseline  Independent with initial HEP    Time  6    Period  Weeks    Status  New      PT LONG TERM GOAL #2   Title  He will report pain as intermittant with work and home tasks    Baseline  pain increaes with use of arms at work and home    Time  6    Period  Weeks    Status  New      PT LONG TERM GOAL #3   Title  He will return to normal sleep without medication need    Baseline  uses meds prior to sleep    Time  6    Period  Weeks    Status  New      PT LONG TERM GOAL #4   Title  shoulder motion will be WNL bilaterally with no pain    Baseline  pain with movenemtn of arms to shoulder height    Time  6    Period  Weeks    Status  New            Plan - 09/19/19 1157    Clinical Impression Statement  He reports decreased symptoms in hands and best felt in awhile.    He will need some  contiued  PT to max progress  Need incr ROM to facilitate this . RT rotaiton WFL but LT still  stiffer.    PT Treatment/Interventions  Dry needling;Manual techniques;Patient/family education;Therapeutic activities;Therapeutic exercise;Electrical Stimulation;Traction;Moist Heat;Iontophoresis '4mg'$ /ml Dexamethasone;Ultrasound;Passive range of motion;Taping    PT Next Visit Plan  Progress  to red band HEP, manual and modalities, assess HMP, he might try IFC next session, continue to push shoulder and neck ROM   , trial of traction    PT Home Exercise Plan  shoulder flexion up wall and cervical retraction and rotation, added LTR and yellow band horizontal abduction, thoracic rotation with cross body shoulder stretch, supine toracic extension with deep breathing , over chair back thoracic ext.    Consulted and Agree with Plan of Care  Patient       Patient will benefit from skilled therapeutic intervention in order to improve the following deficits and impairments:  Postural dysfunction, Increased muscle spasms, Pain, Decreased activity tolerance, Decreased range of motion  Visit Diagnosis: Chronic bilateral thoracic back pain  Cervicalgia  Stiffness of neck  Other muscle spasm  Abnormal posture     Problem List There are no problems to display for this patient.   Darrel Hoover PT 09/19/2019, 12:40 PM  [facility] [address] [place], [place], [address] Phone: [phone]   Fax:  [phone]  Name: Keith Valdez MRN: [id number] Date of Birth: [birth year]

## 2019-10-01 ENCOUNTER — Ambulatory Visit: Payer: Medicaid Other

## 2019-10-14 ENCOUNTER — Ambulatory Visit: Payer: Medicaid Other | Attending: Family Medicine

## 2019-10-14 ENCOUNTER — Other Ambulatory Visit: Payer: Self-pay

## 2019-10-14 DIAGNOSIS — M546 Pain in thoracic spine: Secondary | ICD-10-CM | POA: Insufficient documentation

## 2019-10-14 DIAGNOSIS — M436 Torticollis: Secondary | ICD-10-CM | POA: Insufficient documentation

## 2019-10-14 DIAGNOSIS — G8929 Other chronic pain: Secondary | ICD-10-CM | POA: Diagnosis not present

## 2019-10-14 DIAGNOSIS — M542 Cervicalgia: Secondary | ICD-10-CM | POA: Insufficient documentation

## 2019-10-14 DIAGNOSIS — M62838 Other muscle spasm: Secondary | ICD-10-CM | POA: Insufficient documentation

## 2019-10-14 DIAGNOSIS — R293 Abnormal posture: Secondary | ICD-10-CM | POA: Diagnosis not present

## 2019-10-14 NOTE — Therapy (Addendum)
[facility] [address] [place], [place], [address] Phone: [phone]   Fax:  [phone]  Physical Therapy Treatment  Patient Details  Name: Keith Valdez MRN: [id number] Date of Birth: Sep 25, 1960 Referring Provider (PT): Lavada Mesi, MD   Encounter Date: 10/14/2019  PT End of Session - 10/14/19 1210    Visit Number  6    Number of Visits  12    Date for PT Re-Evaluation  11/15/19    Authorization Type  MCD    PT Start Time  1207    PT Stop Time  1250    PT Time Calculation (min)  43 min    Activity Tolerance  Patient tolerated treatment well;Patient limited by pain    Behavior During Therapy  [facility] for tasks assessed/performed       Past Medical History:  Diagnosis Date  . Depression   . Diabetes mellitus without complication (HCC)     No past surgical history on file.  There were no vitals filed for this visit.  Subjective Assessment - 10/14/19 1213    Subjective  Can  move arms bette butt still still  and pain    Pain Score  6     Pain Location  Thoracic    Pain Orientation  Right;Left;Upper    Pain Descriptors / Indicators  Dull;Aching    Pain Type  Chronic pain    Pain Onset  More than a month ago    Pain Frequency  Constant    Aggravating Factors   activity    Pain Relieving Factors  heat, stretch         [facility] PT Assessment - 10/14/19 0001      ROM / Strength   AROM / PROM / Strength  PROM      AROM   Right Shoulder Flexion  115 Degrees    Left Shoulder Flexion  120 Degrees    Cervical Flexion  40    Cervical Extension  30    Cervical - Right Side Bend  22    Cervical - Left Side Bend  28    Cervical - Right Rotation  45    Cervical - Left Rotation  49      PROM   Overall PROM Comments  140 degrees babduction and flexion bilateral. ER 90 degrees bilaterally      Reviewed HEP. Needed some cuing to initiate but ale to do correctly.              [facility] Adult PT Treatment/Exercise  - 10/14/19 0001      Modalities   Modalities  Traction      Traction   Type of Traction  Cervical    Min (lbs)  5    Max (lbs)  12    Hold Time  60    Rest Time  20    Time  15               PT Short Term Goals - 10/14/19 1238      PT SHORT TERM GOAL #1   Title  He will be independent with initial HEp    Baseline  independent with HEP though needs some cues to initiate    Status  Achieved      PT SHORT TERM GOAL #2   Title  He will demo understanding of good  posture    Baseline  He reports being more mindful of posture. Needs cues in clinic but  less now    Status  Achieved      PT SHORT TERM GOAL #3   Title  10% decreased in pain    Baseline  he reports he is 10% better with movment and pain    Status  Achieved        PT Long Term Goals - 10/14/19 1239      PT LONG TERM GOAL #1   Title  He will be indpendent with all hEP issued    Baseline  Independent with initial HEP    Time  4    Period  Weeks    Status  On-going      PT LONG TERM GOAL #2   Title  He will report pain as intermittant with work and home tasks    Baseline  pain increaes with use of arms at work and home    Time  4    Period  Weeks    Status  On-going      PT LONG TERM GOAL #3   Title  He will return to normal sleep without medication need    Baseline  uses meds prior to sleep still    Time  4    Period  Weeks    Status  On-going      PT LONG TERM GOAL #4   Title  shoulder motion will be WNL bilaterally with no pain    Baseline  Movement is still limited compaired to passive but improved since last measured    Time  4    Period  Weeks    Status  New            Plan - 10/14/19 1210    Clinical Impression Statement  He had some limits in coming to PT and getting back in from last session. He reports doing some better but still limited  with ROM and pain.  Shoulder and neck motion improved  and he has better awareness of posture. I think he would benefit from continued PT  pushing ROM and strength in neck and shoulders and spine.   He should benefit from continued skille d PT.    Personal Factors and Comorbidities  Time since onset of injury/illness/exacerbation    Examination-Activity Limitations  Lift;Stand;Sleep;Sit;Reach Overhead    Examination-Participation Restrictions  Yard Work;Community Activity    Stability/Clinical Decision Making  Stable/Uncomplicated    PT Frequency  2x / week    PT Duration  4 weeks    PT Treatment/Interventions  Dry needling;Manual techniques;Patient/family education;Therapeutic activities;Therapeutic exercise;Electrical Stimulation;Traction;Moist Heat;Iontophoresis 4mg /ml Dexamethasone;Ultrasound;Passive range of motion;Taping    PT Next Visit Plan  Progress  to red band HEP, manual and modalities, assess HMP, he might try IFC next session, continue to push shoulder and neck ROM   ,  traction if beneficial    PT Home Exercise Plan  shoulder flexion up wall and cervical retraction and rotation, added LTR and yellow band horizontal abduction, thoracic rotation with cross body shoulder stretch, supine toracic extension with deep breathing , over chair back thoracic ext.    Consulted and Agree with Plan of Care  Patient       Patient will benefit from skilled therapeutic intervention in order to improve the following deficits and impairments:  Postural dysfunction, Increased muscle spasms, Pain, Decreased activity tolerance, Decreased range of motion  Visit Diagnosis: Chronic bilateral thoracic back pain - Plan: PT plan of care cert/re-cert  Cervicalgia - Plan: PT plan of care cert/re-cert  Stiffness of neck - Plan: PT plan of care cert/re-cert  Other muscle spasm - Plan: PT plan of care cert/re-cert  Abnormal posture - Plan: PT plan of care cert/re-cert     Problem List There are no problems to display for this patient.   Caprice Red  PT 10/14/2019, 12:52 PM  [facility] [address] [place], [place], [address] Phone: [phone]   Fax:  [phone]  Name: Keith Valdez MRN: [id number] Date of Birth: September 11, 1961

## 2019-10-21 ENCOUNTER — Ambulatory Visit: Payer: Medicaid Other | Admitting: Physical Therapy

## 2019-10-21 ENCOUNTER — Encounter: Payer: Self-pay | Admitting: Physical Therapy

## 2019-10-21 ENCOUNTER — Other Ambulatory Visit: Payer: Self-pay

## 2019-10-21 DIAGNOSIS — R293 Abnormal posture: Secondary | ICD-10-CM

## 2019-10-21 DIAGNOSIS — M546 Pain in thoracic spine: Secondary | ICD-10-CM | POA: Diagnosis not present

## 2019-10-21 DIAGNOSIS — M62838 Other muscle spasm: Secondary | ICD-10-CM | POA: Diagnosis not present

## 2019-10-21 DIAGNOSIS — M436 Torticollis: Secondary | ICD-10-CM | POA: Diagnosis not present

## 2019-10-21 DIAGNOSIS — M542 Cervicalgia: Secondary | ICD-10-CM | POA: Diagnosis not present

## 2019-10-21 DIAGNOSIS — G8929 Other chronic pain: Secondary | ICD-10-CM | POA: Diagnosis not present

## 2019-10-21 NOTE — Therapy (Signed)
[facility] Outpatient Rehabilitation [facility] [address] [place], [place], [address] Phone: [phone]   Fax:  [phone]  Physical Therapy Treatment  Patient Details  Name: Keith Valdez MRN: [id number] Date of Birth: November 06, 1960 Referring Provider (PT): Lavada Mesi, MD   Encounter Date: 10/21/2019  PT End of Session - 10/21/19 1250    Visit Number  7    Number of Visits  12    Date for PT Re-Evaluation  11/15/19    Authorization Type  MCD    Authorization Time Period  10/21/19-11/17/19    Authorization - Visit Number  1    Authorization - Number of Visits  8    PT Start Time  1230    PT Stop Time  1315    PT Time Calculation (min)  45 min       Past Medical History:  Diagnosis Date  . Depression   . Diabetes mellitus without complication (HCC)     History reviewed. No pertinent surgical history.  There were no vitals filed for this visit.  Subjective Assessment - 10/21/19 1251    Subjective  Pain is not as constant, Pain level is lower.    Currently in Pain?  No/denies   not really pain just stiff        [facility] PT Assessment - 10/21/19 0001      AROM   Right Shoulder Flexion  125 Degrees   supine   Left Shoulder Flexion  150 Degrees   supine                  [facility] Adult PT Treatment/Exercise - 10/21/19 0001      Neck Exercises: Standing   Other Standing Exercises  bilateral wall slides shoulder flexion x 10 , Hands on wall retract/protract , back on wall with towel at occiput- chin tuck with W- back     Other Standing Exercises  green band rows and extension x 20 each       Neck Exercises: Supine   Neck Retraction Limitations  chin tucks with arm circles , and alternating UE Flex/ext     Other Supine Exercise  horizontal abduction  green band x 10     Other Supine Exercise  supine pullovers with pull on red band x 10       Traction   Min (lbs)  6    Max (lbs)  13    Hold Time  30    Rest Time  10    Time  15                PT Short Term Goals - 10/14/19 1238      PT SHORT TERM GOAL #1   Title  He will be independent with initial HEp    Baseline  independent with HEP though needs some cues to initiate    Status  Achieved      PT SHORT TERM GOAL #2   Title  He will demo understanding of good  posture    Baseline  He reports being more mindful of posture. Needs cues in clinic but less now    Status  Achieved      PT SHORT TERM GOAL #3   Title  10% decreased in pain    Baseline  he reports he is 10% better with movment and pain    Status  Achieved        PT Long Term Goals - 10/14/19 1239  PT LONG TERM GOAL #1   Title  He will be indpendent with all hEP issued    Baseline  Independent with initial HEP    Time  4    Period  Weeks    Status  On-going      PT LONG TERM GOAL #2   Title  He will report pain as intermittant with work and home tasks    Baseline  pain increaes with use of arms at work and home    Time  4    Period  Weeks    Status  On-going      PT LONG TERM GOAL #3   Title  He will return to normal sleep without medication need    Baseline  uses meds prior to sleep still    Time  4    Period  Weeks    Status  On-going      PT LONG TERM GOAL #4   Title  shoulder motion will be WNL bilaterally with no pain    Baseline  Movement is still limited compaired to passive but improved since last measured    Time  4    Period  Weeks    Status  New            Plan - 10/21/19 1317    Clinical Impression Statement  Pt reports improvement in overall pain, the intensity is less. He always has some level of dull pain and stiffness. His motions are more fluid and he adjusted himself less during session. He needs cues to keep shoulders depressed with most therex. Repeated tracton as he reported relief.    PT Next Visit Plan  Progress  to red band HEP, manual and modalities, assess HMP, he might try IFC next session, continue to push shoulder and neck ROM   ,   traction if beneficial    PT Home Exercise Plan  shoulder flexion up wall and cervical retraction and rotation, added LTR and yellow band horizontal abduction, thoracic rotation with cross body shoulder stretch, supine toracic extension with deep breathing , over chair back thoracic ext.       Patient will benefit from skilled therapeutic intervention in order to improve the following deficits and impairments:  Postural dysfunction, Increased muscle spasms, Pain, Decreased activity tolerance, Decreased range of motion  Visit Diagnosis: Chronic bilateral thoracic back pain  Cervicalgia  Stiffness of neck  Other muscle spasm  Abnormal posture     Problem List There are no problems to display for this patient.   Dorene Ar, [place] 10/21/2019, 1:28 PM  [facility] [address] [place], [place], [address] Phone: [phone]   Fax:  [phone]  Name: Keith Valdez MRN: [id number] Date of Birth: 07-01-1961

## 2019-10-25 ENCOUNTER — Encounter: Payer: Self-pay | Admitting: Physical Therapy

## 2019-10-25 ENCOUNTER — Ambulatory Visit: Payer: Medicaid Other | Admitting: Physical Therapy

## 2019-10-25 ENCOUNTER — Other Ambulatory Visit: Payer: Self-pay

## 2019-10-25 DIAGNOSIS — G8929 Other chronic pain: Secondary | ICD-10-CM

## 2019-10-25 DIAGNOSIS — M62838 Other muscle spasm: Secondary | ICD-10-CM | POA: Diagnosis not present

## 2019-10-25 DIAGNOSIS — M542 Cervicalgia: Secondary | ICD-10-CM

## 2019-10-25 DIAGNOSIS — M436 Torticollis: Secondary | ICD-10-CM

## 2019-10-25 DIAGNOSIS — R293 Abnormal posture: Secondary | ICD-10-CM | POA: Diagnosis not present

## 2019-10-25 DIAGNOSIS — M546 Pain in thoracic spine: Secondary | ICD-10-CM

## 2019-10-25 NOTE — Therapy (Signed)
[facility] Outpatient [facility] [address] [place], [place], [address] Phone: [phone]   Fax:  [phone]  Physical Therapy Treatment  Patient Details  Name: Keith Valdez MRN: [id number] Date of Birth: Jul 07, 1961 Referring Provider (PT): Lavada Mesi, MD   Encounter Date: 10/25/2019  PT End of Session - 10/25/19 1150    Visit Number  8    Number of Visits  12    Date for PT Re-Evaluation  11/15/19    Authorization Type  MCD    Authorization Time Period  10/21/19-11/17/19    Authorization - Visit Number  2    Authorization - Number of Visits  8    PT Start Time  1145    PT Stop Time  1225    PT Time Calculation (min)  40 min       Past Medical History:  Diagnosis Date  . Depression   . Diabetes mellitus without complication (HCC)     History reviewed. No pertinent surgical history.  There were no vitals filed for this visit.  Subjective Assessment - 10/25/19 1148    Subjective  The weather is not helping me today, I am tight.    Currently in Pain?  Yes    Pain Score  8     Pain Location  Neck   across top of posterior shoulders   Pain Orientation  Right;Left;Posterior    Pain Descriptors / Indicators  Aching;Dull    Pain Type  Chronic pain    Aggravating Factors   work    Pain Relieving Factors  stretch, heat                       [facility] Adult PT Treatment/Exercise - 10/25/19 0001      Neck Exercises: Standing   Other Standing Exercises  bilateral wall slides shoulder flexion x 10 ,, towel behind head-chin tuck with w Back     Other Standing Exercises  green band rows and extension x 20 each       Neck Exercises: Seated   Neck Retraction  10 reps    Neck Retraction Limitations  back at wall standing    Other Seated Exercise  also seated chin tucks x 10      Neck Exercises: Supine   Neck Retraction Limitations  chin tucks with arm circles , and alternating UE Flex/ext     Other Supine Exercise  horizontal  abduction  green band x 10 , ER x 10     Other Supine Exercise  open book stretches x 5 each       Shoulder Exercises: ROM/Strengthening   UBE (Upper Arm Bike)  4 min L1       Traction   Min (lbs)  8    Max (lbs)  15    Hold Time  60    Rest Time  10    Time  15               PT Short Term Goals - 10/14/19 1238      PT SHORT TERM GOAL #1   Title  He will be independent with initial HEp    Baseline  independent with HEP though needs some cues to initiate    Status  Achieved      PT SHORT TERM GOAL #2   Title  He will demo understanding of good  posture    Baseline  He reports being more mindful of posture.  Needs cues in clinic but less now    Status  Achieved      PT SHORT TERM GOAL #3   Title  10% decreased in pain    Baseline  he reports he is 10% better with movment and pain    Status  Achieved        PT Long Term Goals - 10/14/19 1239      PT LONG TERM GOAL #1   Title  He will be indpendent with all hEP issued    Baseline  Independent with initial HEP    Time  4    Period  Weeks    Status  On-going      PT LONG TERM GOAL #2   Title  He will report pain as intermittant with work and home tasks    Baseline  pain increaes with use of arms at work and home    Time  4    Period  Weeks    Status  On-going      PT LONG TERM GOAL #3   Title  He will return to normal sleep without medication need    Baseline  uses meds prior to sleep still    Time  4    Period  Weeks    Status  On-going      PT LONG TERM GOAL #4   Title  shoulder motion will be WNL bilaterally with no pain    Baseline  Movement is still limited compaired to passive but improved since last measured    Time  4    Period  Weeks    Status  New            Plan - 10/25/19 1219    Clinical Impression Statement  Pt arrives in that he attributes to the colder, wet weather today. He has not performed any HEP since last session. He completes therex and self adjusts his spine frequently.  He declines trial of estim and wants to increased the pull on cervical traction.    PT Next Visit Plan  Progress  to red band HEP, manual and modalities, assess HMP, he might try IFC next session, continue to push shoulder and neck ROM   ,  traction if beneficial    PT Home Exercise Plan  shoulder flexion up wall and cervical retraction and rotation, added LTR and yellow band horizontal abduction, thoracic rotation with cross body shoulder stretch, supine toracic extension with deep breathing , over chair back thoracic ext.       Patient will benefit from skilled therapeutic intervention in order to improve the following deficits and impairments:  Postural dysfunction, Increased muscle spasms, Pain, Decreased activity tolerance, Decreased range of motion  Visit Diagnosis: Chronic bilateral thoracic back pain  Cervicalgia  Stiffness of neck  Other muscle spasm  Abnormal posture     Problem List There are no problems to display for this patient.   Dorene Ar, [place] 10/25/2019, 12:23 PM  [facility] [address] [place], [place], [address] Phone: [phone]   Fax:  [phone]  Name: Daxtin Leiker MRN: [id number] Date of Birth: 03-21-1961

## 2019-10-29 ENCOUNTER — Other Ambulatory Visit: Payer: Self-pay

## 2019-10-29 ENCOUNTER — Ambulatory Visit: Payer: Medicaid Other

## 2019-10-29 DIAGNOSIS — M62838 Other muscle spasm: Secondary | ICD-10-CM

## 2019-10-29 DIAGNOSIS — M546 Pain in thoracic spine: Secondary | ICD-10-CM | POA: Diagnosis not present

## 2019-10-29 DIAGNOSIS — G8929 Other chronic pain: Secondary | ICD-10-CM | POA: Diagnosis not present

## 2019-10-29 DIAGNOSIS — M542 Cervicalgia: Secondary | ICD-10-CM

## 2019-10-29 DIAGNOSIS — R293 Abnormal posture: Secondary | ICD-10-CM | POA: Diagnosis not present

## 2019-10-29 DIAGNOSIS — M436 Torticollis: Secondary | ICD-10-CM

## 2019-10-29 NOTE — Therapy (Signed)
[place], [place], [address] Phone: [phone]   Fax:  [phone]  Physical Therapy Treatment  Patient Details  Name: Keith Valdez MRN: [id number] Date of Birth: 06/29/1961 Referring Provider (PT): Eunice Blase, MD   Encounter Date: 10/29/2019  PT End of Session - 10/29/19 1158    Visit Number  9    Number of Visits  12    Date for PT Re-Evaluation  11/15/19    Authorization Type  MCD    Authorization Time Period  10/21/19-11/17/19    Authorization - Visit Number  3    Authorization - Number of Visits  8    PT Start Time  1200    PT Stop Time  1245    PT Time Calculation (min)  45 min    Activity Tolerance  Patient tolerated treatment well;Patient limited by pain    Behavior During Therapy  [facility] for tasks assessed/performed       Past Medical History:  Diagnosis Date  . Depression   . Diabetes mellitus without complication ([place])     History reviewed. No pertinent surgical history.  There were no vitals filed for this visit.  Subjective Assessment - 10/29/19 1205    Subjective  Sore today. May need to see Dr Junius Roads due to having dropsy with hands.    Pain Score  7     Pain Location  --   across shoulders   Pain Orientation  Right;Left;Posterior    Pain Descriptors / Indicators  Dull;Aching    Pain Type  Chronic pain    Pain Onset  More than a month ago    Pain Frequency  Constant    Aggravating Factors   work    Pain Relieving Factors  heat                       [facility] Adult PT Treatment/Exercise - 10/29/19 0001      Neck Exercises: Standing   Other Standing Exercises  bilateral wall slides shoulder flexion x 15 ,, towel behind head-chin tuck with w Back     Other Standing Exercises  green band rows and extension x 20 each       Neck Exercises: Seated   Neck Retraction  10 reps    Neck Retraction Limitations  back at wall standing      Neck Exercises: Supine   Neck  Retraction Limitations  chin tucks with arm circles , and alternating UE Flex/ext     Other Supine Exercise  horizontal abduction  green band x 15 , ER x 15     Other Supine Exercise  open book stretches x 10 each and 10 reps protraction RT and LT shoulder      Shoulder Exercises: ROM/Strengthening   UBE (Upper Arm Bike)  5 min L1       Traction   Min (lbs)  8    Max (lbs)  16    Hold Time  60    Rest Time  10    Time  15               PT Short Term Goals - 10/14/19 1238      PT SHORT TERM GOAL #1   Title  He will be independent with initial HEp    Baseline  independent with HEP though needs some cues to initiate    Status  Achieved      PT  SHORT TERM GOAL #2   Title  He will demo understanding of good  posture    Baseline  He reports being more mindful of posture. Needs cues in clinic but less now    Status  Achieved      PT SHORT TERM GOAL #3   Title  10% decreased in pain    Baseline  he reports he is 10% better with movment and pain    Status  Achieved        PT Long Term Goals - 10/14/19 1239      PT LONG TERM GOAL #1   Title  He will be indpendent with all hEP issued    Baseline  Independent with initial HEP    Time  4    Period  Weeks    Status  On-going      PT LONG TERM GOAL #2   Title  He will report pain as intermittant with work and home tasks    Baseline  pain increaes with use of arms at work and home    Time  4    Period  Weeks    Status  On-going      PT LONG TERM GOAL #3   Title  He will return to normal sleep without medication need    Baseline  uses meds prior to sleep still    Time  4    Period  Weeks    Status  On-going      PT LONG TERM GOAL #4   Title  shoulder motion will be WNL bilaterally with no pain    Baseline  Movement is still limited compaired to passive but improved since last measured    Time  4    Period  Weeks    Status  New            Plan - 10/29/19 1158    Clinical Impression Statement  He  continues to report high pain levels and now reports decr grip strength.  Bilateral he was able to grip 80#.  Overall he is better per his report. He states pain less overall  and mostly increases with work. He reports fingers parasthesias    PT Treatment/Interventions  Dry needling;Manual techniques;Patient/family education;Therapeutic activities;Therapeutic exercise;Electrical Stimulation;Traction;Moist Heat;Iontophoresis 4mg /ml Dexamethasone;Ultrasound;Passive range of motion;Taping    PT Next Visit Plan  Progress  to green/blue  band HEP, manual and modalities, assess HMP, he might try IFC next session, continue to push shoulder and neck ROM   ,  traction increase to 20 # if comfortable to pt   GOALS and ROM arms/Neck    PT Home Exercise Plan  shoulder flexion up wall and cervical retraction and rotation, added LTR and yellow band horizontal abduction, thoracic rotation with cross body shoulder stretch, supine toracic extension with deep breathing , over chair back thoracic ext.    Consulted and Agree with Plan of Care  Patient       Patient will benefit from skilled therapeutic intervention in order to improve the following deficits and impairments:  Postural dysfunction, Increased muscle spasms, Pain, Decreased activity tolerance, Decreased range of motion  Visit Diagnosis: Chronic bilateral thoracic back pain  Stiffness of neck  Cervicalgia  Other muscle spasm  Abnormal posture     Problem List There are no problems to display for this patient.    PT 10/29/2019, 12:49 PM  [facility] [address] [place], [place], [place]  Phone: [phone]   Fax:  [phone]  Name: Jaythan Hinely MRN: [id number] Date of Birth: April 20, 1961

## 2019-10-31 ENCOUNTER — Ambulatory Visit: Payer: Medicaid Other

## 2019-11-04 ENCOUNTER — Other Ambulatory Visit: Payer: Self-pay

## 2019-11-04 ENCOUNTER — Ambulatory Visit: Payer: Medicaid Other

## 2019-11-04 DIAGNOSIS — R293 Abnormal posture: Secondary | ICD-10-CM | POA: Diagnosis not present

## 2019-11-04 DIAGNOSIS — M436 Torticollis: Secondary | ICD-10-CM | POA: Diagnosis not present

## 2019-11-04 DIAGNOSIS — G8929 Other chronic pain: Secondary | ICD-10-CM

## 2019-11-04 DIAGNOSIS — M62838 Other muscle spasm: Secondary | ICD-10-CM

## 2019-11-04 DIAGNOSIS — M542 Cervicalgia: Secondary | ICD-10-CM | POA: Diagnosis not present

## 2019-11-04 DIAGNOSIS — M546 Pain in thoracic spine: Secondary | ICD-10-CM

## 2019-11-04 NOTE — Therapy (Signed)
[facility] [address] [place], [place], [address] Phone: [phone]   Fax:  [phone]  Physical Therapy Treatment  Patient Details  Name: Keith Valdez MRN: [id number] Date of Birth: [birth year] Referring Provider (PT): Lavada Mesi, MD   Encounter Date: 11/04/2019  PT End of Session - 11/04/19 1219    Visit Number  10    Number of Visits  12    Date for PT Re-Evaluation  11/15/19    Authorization Type  MCD    Authorization Time Period  10/21/19-11/17/19    Authorization - Visit Number  4    Authorization - Number of Visits  8    PT Start Time  1215    PT Stop Time  1300    PT Time Calculation (min)  45 min    Activity Tolerance  Patient tolerated treatment well;Patient limited by pain    Behavior During Therapy  [facility] for tasks assessed/performed       Past Medical History:  Diagnosis Date  . Depression   . Diabetes mellitus without complication ([facility])     History reviewed. No pertinent surgical history.  There were no vitals filed for this visit.      [facility] PT Assessment - 11/04/19 0001      AROM   Overall AROM Comments  PATROM of shoulder WNL with scapula depression at end range.    Right Shoulder Flexion  125 Degrees   seated   Left Shoulder Flexion  130 Degrees   seated   Cervical - Right Side Bend  22    Cervical - Left Side Bend  30    Cervical - Right Rotation  60    Cervical - Left Rotation  50                   [facility] Adult PT Treatment/Exercise - 11/04/19 0001      Neck Exercises: Standing   Other Standing Exercises  bilateral wall slides shoulder flexion x 15 ,, -chin tuck with  fACIng wall     Other Standing Exercises  green band rows and extension x 20 each       Neck Exercises: Seated   Neck Retraction  10 reps    Neck Retraction Limitations  back at door frame standing      Traction   Type of Traction  Cervical    Min (lbs)  8    Max (lbs)  18    Hold Time  60    Rest  Time  15    Time  15      Manual Therapy   Joint Mobilization  thoracici and cervial Gr 2-3 PA glides central and lateral    Soft tissue mobilization  cervical and thoracic paraspinals    Passive ROM  neck rotation and sidebending               PT Short Term Goals - 10/14/19 1238      PT SHORT TERM GOAL #1   Title  He will be independent with initial HEp    Baseline  independent with HEP though needs some cues to initiate    Status  Achieved      PT SHORT TERM GOAL #2   Title  He will demo understanding of good  posture    Baseline  He reports being more mindful of posture. Needs cues in clinic but less now    Status  Achieved  PT SHORT TERM GOAL #3   Title  10% decreased in pain    Baseline  he reports he is 10% better with movment and pain    Status  Achieved        PT Long Term Goals - 11/04/19 1316      PT LONG TERM GOAL #1   Title  He will be indpendent with all hEP issued    Baseline  Independent with initial HEP    Status  On-going      PT LONG TERM GOAL #2   Title  He will report pain as intermittant with work and home tasks    Baseline  pain increaes with use of arms at work and home    Status  On-going      PT [place] #3   Title  He will return to normal sleep without medication need    Baseline  uses meds prior to sleep still    Status  On-going      PT LONG TERM GOAL #4   Title  shoulder motion will be WNL bilaterally with no pain    Baseline  Movement is still limited compared to passive but improved    Status  On-going            Plan - 11/04/19 1220    Clinical Impression Statement  Over all no improvement but he felt better after cervical traction. He feels weather impacts his pain. PROM of sholder is WNL  and though active improved from eval  stil significantly limited.  Will complete POC anf probable discharge.    PT Treatment/Interventions  Dry needling;Manual techniques;Patient/family education;Therapeutic  activities;Therapeutic exercise;Electrical Stimulation;Traction;Moist Heat;Iontophoresis 4mg /ml Dexamethasone;Ultrasound;Passive range of motion;Taping    PT Next Visit Plan  Progress  to /blue  band HEP, manual and modalities,  continue to push shoulder and neck ROM   ,  traction increase to 20 # if comfortable to pt   GOALS and ROM arms/Neck    PT Home Exercise Plan  shoulder flexion up wall and cervical retraction and rotation, added LTR and yellow band horizontal abduction, thoracic rotation with cross body shoulder stretch, supine toracic extension with deep breathing , over chair back thoracic ext.    Consulted and Agree with Plan of Care  Patient       Patient will benefit from skilled therapeutic intervention in order to improve the following deficits and impairments:  Postural dysfunction, Increased muscle spasms, Pain, Decreased activity tolerance, Decreased range of motion  Visit Diagnosis: Stiffness of neck  Chronic bilateral thoracic back pain  Cervicalgia  Other muscle spasm     Problem List There are no problems to display for this patient.   Darrel Hoover  PT 11/04/2019, 1:17 PM  [facility] [address] [place], [place], [address] Phone: [phone]   Fax:  [phone]  Name: Keith Valdez MRN: [id number] Date of Birth: Dec 08, 1960

## 2019-11-07 ENCOUNTER — Other Ambulatory Visit: Payer: Self-pay

## 2019-11-07 ENCOUNTER — Ambulatory Visit: Payer: Medicaid Other

## 2019-11-07 DIAGNOSIS — M62838 Other muscle spasm: Secondary | ICD-10-CM

## 2019-11-07 DIAGNOSIS — M542 Cervicalgia: Secondary | ICD-10-CM

## 2019-11-07 DIAGNOSIS — G8929 Other chronic pain: Secondary | ICD-10-CM | POA: Diagnosis not present

## 2019-11-07 DIAGNOSIS — M546 Pain in thoracic spine: Secondary | ICD-10-CM | POA: Diagnosis not present

## 2019-11-07 DIAGNOSIS — M436 Torticollis: Secondary | ICD-10-CM

## 2019-11-07 DIAGNOSIS — R293 Abnormal posture: Secondary | ICD-10-CM | POA: Diagnosis not present

## 2019-11-07 NOTE — Therapy (Signed)
[place], [place], [address] Phone: [phone]   Fax:  [phone]  Physical Therapy Treatment  Patient Details  Name: Keith Valdez MRN: [id number] Date of Birth: 1961-05-20 Referring Provider (PT): Eunice Blase, MD   Encounter Date: 11/07/2019  PT End of Session - 11/07/19 1159    Visit Number  11    Number of Visits  15    Date for PT Re-Evaluation  12/06/19    Authorization Type  MCD    Authorization Time Period  10/21/19-11/17/19    Authorization - Visit Number  5    Authorization - Number of Visits  8    PT Start Time  1200    PT Stop Time  1250    PT Time Calculation (min)  50 min    Activity Tolerance  Patient tolerated treatment well;Patient limited by pain    Behavior During Therapy  [facility] for tasks assessed/performed       Past Medical History:  Diagnosis Date  . Depression   . Diabetes mellitus without complication ([place])     History reviewed. No pertinent surgical history.  There were no vitals filed for this visit.  Subjective Assessment - 11/07/19 1202    Subjective  Good and bad days.   Today better pain 5/10.  He reports pain is less but still constant and pain worse on RT scapula.    Pain Score  5     Pain Location  Neck   across shoulders   Pain Orientation  Right;Left;Posterior    Pain Descriptors / Indicators  Aching;Dull    Pain Type  Chronic pain    Pain Onset  More than a month ago    Pain Frequency  Constant    Aggravating Factors   work    Pain Relieving Factors  heat, traction         [facility] PT Assessment - 11/07/19 0001      Assessment   Medical Diagnosis  cervical and thoracic pain    Referring Provider (PT)  Eunice Blase, MD      Cognition   Overall Cognitive Status  Within Functional Limits for tasks assessed      Posture/Postural Control   Posture Comments  able to correct but states it increases pain      AROM   Overall AROM Comments  Active AROM  of shoulder WNL with scapula depression at end range.    Right Shoulder Flexion  145 Degrees    Left Shoulder Flexion  145 Degrees    Cervical Flexion  38    Cervical Extension  30    Cervical - Right Side Bend  25    Cervical - Left Side Bend  29    Cervical - Right Rotation  55    Cervical - Left Rotation  50      Strength   Overall Strength Comments  UE WNL but with some pain on shoulder testing                           PT Education - 11/07/19 1240    Education Details  Discussed area of pain /muscle and attachements of rhomboids and levator. and need to stretch this consistently. Also discussed dry needling  for muscle spasm an d he stated he would try this.    Person(s) Educated  Patient    Methods  Explanation    Comprehension  Verbalized  understanding       PT Short Term Goals - 10/14/19 1238      PT SHORT TERM GOAL #1   Title  He will be independent with initial HEp    Baseline  independent with HEP though needs some cues to initiate    Status  Achieved      PT SHORT TERM GOAL #2   Title  He will demo understanding of good  posture    Baseline  He reports being more mindful of posture. Needs cues in clinic but less now    Status  Achieved      PT SHORT TERM GOAL #3   Title  10% decreased in pain    Baseline  he reports he is 10% better with movment and pain    Status  Achieved        PT Long Term Goals - 11/07/19 1203      PT LONG TERM GOAL #1   Title  He will be indpendent with all hEP issued    Baseline  Independent with initial HEP    Time  2    Status  On-going      PT LONG TERM GOAL #2   Title  He will report pain as intermittant with work and home tasks    Baseline  pain increaes with use of arms at work and home, pain constant but intensiuty is diminished per pt and mostly RT scapula    Time  2    Status  On-going      PT LONG TERM GOAL #3   Title  He will return to normal sleep without medication need    Baseline  uses  meds prior to sleep still    Time  2    Status  On-going      PT LONG TERM GOAL #4   Title  shoulder motion will be WNL bilaterally with no pain    Baseline  Movement is still limited compared to passive but significantly improved    Time  2    Status  Partially Met            Plan - 11/07/19 1200    Clinical Impression Statement  Better today but today is warmer.   Traction eased pain but for only a few hours. he is overall better with decreased pain intensity though this is variabl and improved shoulder ROM.   If we can get the levator spasm to calm down he may overall improve with ROM and more consistent decr pain.  Will try for 4 more sessions to see if pain can improve with dry needling  and traction/exercise.    PT Frequency  2x / week    PT Duration  2 weeks    PT Treatment/Interventions  Dry needling;Manual techniques;Patient/family education;Therapeutic activities;Therapeutic exercise;Electrical Stimulation;Traction;Moist Heat;Iontophoresis '4mg'$ /ml Dexamethasone;Ultrasound;Passive range of motion;Taping    PT Next Visit Plan  Progress  to /blue  band HEP, manual and modalities,  continue to push shoulder and neck ROM   ,  traction increase to 20 #              Dry needling if more sessions authorized    PT Home Exercise Plan  shoulder flexion up wall and cervical retraction and rotation, added LTR and yellow band horizontal abduction, thoracic rotation with cross body shoulder stretch, supine toracic extension with deep breathing , over chair back thoracic ext.    Consulted and Agree with Plan of Care  Patient       Patient will benefit from skilled therapeutic intervention in order to improve the following deficits and impairments:  Postural dysfunction, Increased muscle spasms, Pain, Decreased activity tolerance, Decreased range of motion  Visit Diagnosis: Stiffness of neck  Chronic bilateral thoracic back pain  Cervicalgia  Other muscle spasm     Problem  List There are no problems to display for this patient.   Darrel Hoover  PT 11/07/2019, 1:07 PM  [facility] [address] [place], [place], [address] Phone: [phone]   Fax:  [phone]  Name: Keith Valdez MRN: [id number] Date of Birth: 03-03-1961

## 2019-11-19 ENCOUNTER — Other Ambulatory Visit: Payer: Self-pay

## 2019-11-19 ENCOUNTER — Encounter: Payer: Self-pay | Admitting: Physical Therapy

## 2019-11-19 ENCOUNTER — Ambulatory Visit: Payer: Medicaid Other | Attending: Family Medicine | Admitting: Physical Therapy

## 2019-11-19 DIAGNOSIS — R293 Abnormal posture: Secondary | ICD-10-CM | POA: Insufficient documentation

## 2019-11-19 DIAGNOSIS — M62838 Other muscle spasm: Secondary | ICD-10-CM | POA: Diagnosis not present

## 2019-11-19 DIAGNOSIS — G8929 Other chronic pain: Secondary | ICD-10-CM | POA: Insufficient documentation

## 2019-11-19 DIAGNOSIS — M546 Pain in thoracic spine: Secondary | ICD-10-CM | POA: Insufficient documentation

## 2019-11-19 DIAGNOSIS — M436 Torticollis: Secondary | ICD-10-CM

## 2019-11-19 DIAGNOSIS — M542 Cervicalgia: Secondary | ICD-10-CM | POA: Diagnosis not present

## 2019-11-19 NOTE — Patient Instructions (Addendum)
Trigger Point Dry Needling  . What is Trigger Point Dry Needling (DN)? o DN is a physical therapy technique used to treat muscle pain and dysfunction. Specifically, DN helps deactivate muscle trigger points (muscle knots).  o A thin filiform needle is used to penetrate the skin and stimulate the underlying trigger point. The goal is for a local twitch response (LTR) to occur and for the trigger point to relax. No medication of any kind is injected during the procedure.   . What Does Trigger Point Dry Needling Feel Like?  o The procedure feels different for each individual patient. Some patients report that they do not actually feel the needle enter the skin and overall the process is not painful. Very mild bleeding may occur. However, many patients feel a deep cramping in the muscle in which the needle was inserted. This is the local twitch response.   Marland Kitchen How Will I feel after the treatment? o Soreness is normal, and the onset of soreness may not occur for a few hours. Typically this soreness does not last longer than two days.  o Bruising is uncommon, however; ice can be used to decrease any possible bruising.  o In rare cases feeling tired or nauseous after the treatment is normal. In addition, your symptoms may get worse before they get better, this period will typically not last longer than 24 hours.   . What Can I do After My Treatment? o Increase your hydration by drinking more water for the next 24 hours. o You may place ice or heat on the areas treated that have become sore, however, do not use heat on inflamed or bruised areas. Heat often brings more relief post needling. o You can continue your regular activities, but vigorous activity is not recommended initially after the treatment for 24 hours. o DN is best combined with other physical therapy such as strengthening, stretching, and other therapies.     Garen Lah, PT Certified Exercise Expert for the Aging Adult  11/19/19  1:27 PM Phone: [phone] Fax: [phone]

## 2019-11-19 NOTE — Therapy (Addendum)
[place], [place], [address] Phone: [phone]   Fax:  [phone]  Physical Therapy Treatment  Patient Details  Name: Keith Valdez MRN: [id number] Date of Birth: 01-29-1961 Referring Provider (PT): Eunice Blase, MD   Encounter Date: 11/19/2019  PT End of Session - 11/19/19 1325    Visit Number  12    Number of Visits  15    Date for PT Re-Evaluation  12/06/19    Authorization Type  MCD    Authorization Time Period  11/18/19 to 12/08/19    PT Start Time  1330    PT Stop Time  1430    PT Time Calculation (min)  60 min       Past Medical History:  Diagnosis Date  . Depression   . Diabetes mellitus without complication ([place])     History reviewed. No pertinent surgical history.  There were no vitals filed for this visit.  Subjective Assessment - 11/19/19 1336    Subjective  I have a cervical problems but I am also a DM. could be a neuropathy  I am willing try dry needling today    Pertinent History  Previous injury  with same symptoms.    Limitations  Lifting;Standing;Sitting    Diagnostic tests  None    Patient Stated Goals  He wants to  decrease pain.    Currently in Pain?  Yes    Pain Score  5     Pain Location  Neck    Pain Orientation  Right;Left    Pain Descriptors / Indicators  Dull;Numbness    Pain Type  Chronic pain    Pain Onset  More than a month ago    Pain Frequency  Constant         [facility] PT Assessment - 11/19/19 0001      AROM   Cervical - Right Rotation  60   no pain and no numbness   Cervical - Left Rotation  52                   [facility] Adult PT Treatment/Exercise - 11/19/19 0001      Moist Heat Therapy   Number Minutes Moist Heat  12 Minutes    Moist Heat Location  Cervical      Manual Therapy   Manual Therapy  Soft tissue mobilization;Myofascial release;Joint mobilization    Manual therapy comments  skilled palpation with TPDN    Joint Mobilization   RT UPA C-3 to C-7 grade 2/3    Soft tissue mobilization  cervical and thoracic paraspinals RT scalenes, upper trap and levator, rhomboids sub scap    Myofascial Release  RT upper tra and levator, rhomobids and sub scap   Rt only   Manual Traction  cervcial distraction, sub occiptial release      Neck Exercises: Stretches   Upper Trapezius Stretch  3 reps;30 seconds;Right    Upper Trapezius Stretch Limitations  VC and TC for correct execution    Levator Stretch  3 reps;Right;30 seconds    Levator Stretch Limitations  VC and TC for correct execution    Other Neck Stretches  10 x deep neck flexor and 10 sec hold with towel behind head    Other Neck Stretches  scalene stretch 3 x 30 sec with correct execution       Trigger Point Dry Needling - 11/19/19 0001    Consent Given?  Yes    Education Handout  Provided  Yes    Muscles Treated Head and Neck  Upper trapezius;Levator scapulae;Scalenes   RT side only   Muscles Treated Upper Quadrant  Rhomboids;Subscapularis   RT side only   Upper Trapezius Response  Twitch reponse elicited;Palpable increased muscle length    Levator Scapulae Response  Twitch response elicited;Palpable increased muscle length    Scalenes Response  Palpable increased muscle length    Rhomboids Response  Twitch response elicited;Palpable increased muscle length    Subscapularis Response  Palpable increased muscle length           PT Education - 11/19/19 1327    Education Details  Educated on TPDN after care and precautians    Person(s) Educated  Patient    Methods  Explanation;Demonstration    Comprehension  Verbalized understanding;Returned demonstration       PT Short Term Goals - 10/14/19 1238      PT SHORT TERM GOAL #1   Title  He will be independent with initial HEp    Baseline  independent with HEP though needs some cues to initiate    Status  Achieved      PT SHORT TERM GOAL #2   Title  He will demo understanding of good  posture    Baseline   He reports being more mindful of posture. Needs cues in clinic but less now    Status  Achieved      PT SHORT TERM GOAL #3   Title  10% decreased in pain    Baseline  he reports he is 10% better with movment and pain    Status  Achieved        PT Long Term Goals - 11/07/19 1203      PT LONG TERM GOAL #1   Title  He will be indpendent with all hEP issued    Baseline  Independent with initial HEP    Time  2    Status  On-going      PT LONG TERM GOAL #2   Title  He will report pain as intermittant with work and home tasks    Baseline  pain increaes with use of arms at work and home, pain constant but intensiuty is diminished per pt and mostly RT scapula    Time  2    Status  On-going      PT LONG TERM GOAL #3   Title  He will return to normal sleep without medication need    Baseline  uses meds prior to sleep still    Time  2    Status  On-going      PT LONG TERM GOAL #4   Title  shoulder motion will be WNL bilaterally with no pain    Baseline  Movement is still limited compared to passive but significantly improved    Time  2    Status  Partially Met            Plan - 11/19/19 1413    Clinical Impression Statement  Pt enters clinic with 5 /10 pain and numbnessin RT last two fingers.  Pt consents to TPDN and is closely monitored through out session.  Reinforced Deep neck flexor stretch and HEP neck exericises and was able to perform neck stretches with greater ease post TPDN   at the end of TPDN, pt notes not tingling in RT lateral 2 fingers. Will continue POC    Personal Factors and Comorbidities  Time since onset of injury/illness/exacerbation  Examination-Activity Limitations  Lift;Stand;Sleep;Sit;Reach Overhead    Examination-Participation Restrictions  Yard Work;Community Activity    Stability/Clinical Decision Making  Stable/Uncomplicated    Clinical Decision Making  Low    Rehab Potential  Good    PT Frequency  2x / week    PT Duration  2 weeks    PT  Treatment/Interventions  Dry needling;Manual techniques;Patient/family education;Therapeutic activities;Therapeutic exercise;Electrical Stimulation;Traction;Moist Heat;Iontophoresis 93m/ml Dexamethasone;Ultrasound;Passive range of motion;Taping    PT Next Visit Plan  assess TPDN and check numbness of lateral fingers R UE    PT Home Exercise Plan  deep neck flexor with towel roll, shoulder flexion up wall and cervical retraction and rotation, added LTR and yellow band horizontal abduction, thoracic rotation with cross body shoulder stretch, supine toracic extension with deep breathing , over chair back thoracic ext.    Consulted and Agree with Plan of Care  Patient      Access Code: [email or web address] URL: [email or web address]  Date: 11/19/2019  Prepared by: LVoncille Lo  Exercises  Supine Deep Neck Flexor Training - 10 reps - 1 sets - 10 hold - 1-2x daily - 7x weekly    Patient will benefit from skilled therapeutic intervention in order to improve the following deficits and impairments:  Postural dysfunction, Increased muscle spasms, Pain, Decreased activity tolerance, Decreased range of motion  Visit Diagnosis: Stiffness of neck  Chronic bilateral thoracic back pain  Cervicalgia  Other muscle spasm  Abnormal posture     Problem List There are no problems to display for this patient.  LVoncille Lo PT Certified Exercise Expert for the Aging Adult  11/19/19 2:45 PM Phone: [phone]: 3[facility] [address]Phone: [phone]  Fax:  [phone] Name: ETauno FaloticoMRN: [id number] of Birth: 302/17/1962

## 2019-11-21 ENCOUNTER — Ambulatory Visit: Payer: Medicaid Other | Admitting: Physical Therapy

## 2019-11-21 ENCOUNTER — Other Ambulatory Visit: Payer: Self-pay

## 2019-11-21 ENCOUNTER — Encounter: Payer: Self-pay | Admitting: Physical Therapy

## 2019-11-21 DIAGNOSIS — M436 Torticollis: Secondary | ICD-10-CM | POA: Diagnosis not present

## 2019-11-21 DIAGNOSIS — M546 Pain in thoracic spine: Secondary | ICD-10-CM | POA: Diagnosis not present

## 2019-11-21 DIAGNOSIS — R293 Abnormal posture: Secondary | ICD-10-CM

## 2019-11-21 DIAGNOSIS — G8929 Other chronic pain: Secondary | ICD-10-CM | POA: Diagnosis not present

## 2019-11-21 DIAGNOSIS — M542 Cervicalgia: Secondary | ICD-10-CM

## 2019-11-21 DIAGNOSIS — M62838 Other muscle spasm: Secondary | ICD-10-CM

## 2019-11-21 NOTE — Therapy (Signed)
[place], [place], [address] Phone: [phone]   Fax:  [phone]  Physical Therapy Treatment  Patient Details  Name: Keith Valdez MRN: [id number] Date of Birth: September 14, 1960 Referring Provider (PT): Eunice Blase, MD   Encounter Date: 11/21/2019  PT End of Session - 11/21/19 1331    Visit Number  13    Number of Visits  15    Date for PT Re-Evaluation  12/06/19    Authorization Type  MCD    Authorization Time Period  11/18/19 to 12/08/19    PT Start Time  1330    PT Stop Time  1428    PT Time Calculation (min)  58 min    Activity Tolerance  Patient tolerated treatment well;Patient limited by pain    Behavior During Therapy  [facility] for tasks assessed/performed       Past Medical History:  Diagnosis Date  . Depression   . Diabetes mellitus without complication ([place])     History reviewed. No pertinent surgical history.  There were no vitals filed for this visit.  Subjective Assessment - 11/21/19 1334    Subjective  I am now having feeling in my right pinkie finger now and I wasnt before TPDN.  I feel like my muscles have been "tenderized"    Pertinent History  Previous injury  with same symptoms.    Limitations  Lifting;Standing;Sitting    Diagnostic tests  None    Patient Stated Goals  He wants to  decrease pain.    Currently in Pain?  Yes    Pain Score  3     Pain Location  Neck    Pain Orientation  Right;Left    Pain Descriptors / Indicators  Tender    Pain Type  Chronic pain    Pain Onset  More than a month ago    Pain Frequency  Intermittent                       [facility] Adult PT Treatment/Exercise - 11/21/19 0001      Shoulder Exercises: ROM/Strengthening   Other ROM/Strengthening Exercises  blue t band bil ext, bil rows and bil horiizontal abd  3 x 10 each with 3 sec hold VC and  TC for correct execution    Other ROM/Strengthening Exercises  pallof press 2 x 10 and then  to RT rot 2 x 10 and to LT rotation 2 x 10      Moist Heat Therapy   Number Minutes Moist Heat  15 Minutes    Moist Heat Location  Cervical   thoracic     Manual Therapy   Manual Therapy  Soft tissue mobilization;Myofascial release;Joint mobilization    Manual therapy comments  skilled palpation with TPDN    Joint Mobilization  RT UPA C-3 to C-7 grade 2/3    Soft tissue mobilization  cervical and thoracic paraspinals RT scalenes, upper trap and levator, rhomboids sub scap   bil   Myofascial Release  RT upper tra and levator, rhomobids and sub scap   Rt only   Manual Traction  cervcial distraction, sub occiptial release       Trigger Point Dry Needling - 11/21/19 0001    Consent Given?  Yes    Education Handout Provided  Previously provided    Muscles Treated Head and Neck  Upper trapezius;Levator scapulae   RT side only   Muscles Treated Upper Quadrant  Subscapularis  RT side only   Dry Needling Comments  40 mm .25 gage    Upper Trapezius Response  Twitch reponse elicited;Palpable increased muscle length    Suboccipitals Response  Palpable increased muscle length    Levator Scapulae Response  Twitch response elicited;Palpable increased muscle length    Cervical multifidi Response  Twitch reponse elicited;Palpable increased muscle length   C-2 t C-7 bil   Subscapularis Response  Palpable increased muscle length           PT Education - 11/21/19 1350    Education Details  added to HEP for anti rotation and strengthening    Person(s) Educated  Patient    Methods  Explanation;Demonstration;Tactile cues;Verbal cues;Handout    Comprehension  Verbalized understanding;Returned demonstration       PT Short Term Goals - 10/14/19 1238      PT SHORT TERM GOAL #1   Title  He will be independent with initial HEp    Baseline  independent with HEP though needs some cues to initiate    Status  Achieved      PT SHORT TERM GOAL #2   Title  He will demo understanding of good   posture    Baseline  He reports being more mindful of posture. Needs cues in clinic but less now    Status  Achieved      PT SHORT TERM GOAL #3   Title  10% decreased in pain    Baseline  he reports he is 10% better with movment and pain    Status  Achieved        PT Long Term Goals - 11/07/19 1203      PT LONG TERM GOAL #1   Title  He will be indpendent with all hEP issued    Baseline  Independent with initial HEP    Time  2    Status  On-going      PT LONG TERM GOAL #2   Title  He will report pain as intermittant with work and home tasks    Baseline  pain increaes with use of arms at work and home, pain constant but intensiuty is diminished per pt and mostly RT scapula    Time  2    Status  On-going      PT LONG TERM GOAL #3   Title  He will return to normal sleep without medication need    Baseline  uses meds prior to sleep still    Time  2    Status  On-going      PT LONG TERM GOAL #4   Title  shoulder motion will be WNL bilaterally with no pain    Baseline  Movement is still limited compared to passive but significantly improved    Time  2    Status  Partially Met            Plan - 11/21/19 1414    Clinical Impression Statement  Pt returns to cliniic with decreased tissue tension in RT upper trap and levator as well as decreased numbness in 5th RT finger.  Pain now 3/10 tight and slightly sore.  Pt requests to continue TPDN and was closely monitored throughout session. Pt does have multiple poppping with manual therapy with distraction.  Pt does improve with exercises and was educated on importance of pain control and benefit of gettting stonger for pain control.  Will continue POC    Personal Factors and Comorbidities  Time since onset  of injury/illness/exacerbation    Examination-Activity Limitations  Lift;Stand;Sleep;Sit;Reach Overhead    Examination-Participation Restrictions  Yard Work;Community Activity    PT Frequency  2x / week    PT Duration  2 weeks     PT Treatment/Interventions  Dry needling;Manual techniques;Patient/family education;Therapeutic activities;Therapeutic exercise;Electrical Stimulation;Traction;Moist Heat;Iontophoresis 30m/ml Dexamethasone;Ultrasound;Passive range of motion;Taping    PT Next Visit Plan  assess TPDN and check numbness of lateral fingers R UE Goals next visit FOTO?    PT Home Exercise Plan  deep neck flexor with towel roll, shoulder flexion up wall and cervical retraction and rotation, added LTR and yellow band horizontal abduction, thoracic rotation with cross body shoulder stretch, supine toracic extension with deep breathing , over chair back thoracic ext.    Consulted and Agree with Plan of Care  Patient       Patient will benefit from skilled therapeutic intervention in order to improve the following deficits and impairments:  Postural dysfunction, Increased muscle spasms, Pain, Decreased activity tolerance, Decreased range of motion  Visit Diagnosis: Stiffness of neck  Chronic bilateral thoracic back pain  Cervicalgia  Other muscle spasm  Abnormal posture     Problem List There are no problems to display for this patient.   LVoncille Lo PT Certified Exercise Expert for the Aging Adult  11/21/19 2:18 PM Phone: [phone]: 3[facility] [address]Phone: [phone]  Fax:  [phone] Name: Keith BuschMRN: [id number] of Birth: [birth year]

## 2019-11-21 NOTE — Patient Instructions (Signed)
   Garen Lah, PT Certified Exercise Expert for the Aging Adult  11/21/19 1:50 PM Phone: [phone] Fax: [phone]

## 2019-11-26 ENCOUNTER — Encounter: Payer: Self-pay | Admitting: Physical Therapy

## 2019-11-26 ENCOUNTER — Other Ambulatory Visit: Payer: Self-pay

## 2019-11-26 ENCOUNTER — Ambulatory Visit: Payer: Medicaid Other | Admitting: Physical Therapy

## 2019-11-26 DIAGNOSIS — M62838 Other muscle spasm: Secondary | ICD-10-CM | POA: Diagnosis not present

## 2019-11-26 DIAGNOSIS — M542 Cervicalgia: Secondary | ICD-10-CM

## 2019-11-26 DIAGNOSIS — M436 Torticollis: Secondary | ICD-10-CM | POA: Diagnosis not present

## 2019-11-26 DIAGNOSIS — M546 Pain in thoracic spine: Secondary | ICD-10-CM | POA: Diagnosis not present

## 2019-11-26 DIAGNOSIS — G8929 Other chronic pain: Secondary | ICD-10-CM

## 2019-11-26 DIAGNOSIS — R293 Abnormal posture: Secondary | ICD-10-CM

## 2019-11-26 NOTE — Therapy (Signed)
[facility] [address] [place], [place], [address] Phone: [phone]   Fax:  [phone]  Physical Therapy Treatment  Patient Details  Name: Keith Valdez MRN: [id number] Date of Birth: 1961-04-29 Referring Provider (PT): Lavada Mesi, MD   Encounter Date: 11/26/2019  PT End of Session - 11/26/19 1340    Visit Number  14    Number of Visits  15    Date for PT Re-Evaluation  12/06/19    Authorization Type  MCD    Authorization Time Period  11/18/19 to 12/08/19    Authorization - Visit Number  6    Authorization - Number of Visits  8    PT Start Time  1333    PT Stop Time  1426    PT Time Calculation (min)  53 min    Activity Tolerance  Patient tolerated treatment well;Patient limited by pain    Behavior During Therapy  [facility] for tasks assessed/performed       Past Medical History:  Diagnosis Date  . Depression   . Diabetes mellitus without complication ([facility])     History reviewed. No pertinent surgical history.  There were no vitals filed for this visit.  Subjective Assessment - 11/26/19 1338    Subjective  My last finger started feeling numb again, so the TPDN is short lived.    Currently in Pain?  Yes    Pain Score  3     Pain Location  Neck    Pain Orientation  Right;Left    Pain Descriptors / Indicators  Tightness    Pain Type  Chronic pain    Pain Onset  More than a month ago    Pain Frequency  Intermittent                       [facility] Adult PT Treatment/Exercise - 11/26/19 0001      Shoulder Exercises: ROM/Strengthening   Other ROM/Strengthening Exercises  blue t band from floor resistance for bil scaption. bil abd and bil flex 1 x 10 each way      Moist Heat Therapy   Number Minutes Moist Heat  15 Minutes    Moist Heat Location  Cervical   thoracic     Manual Therapy   Manual Therapy  Soft tissue mobilization;Myofascial release;Joint mobilization    Manual therapy comments  skilled  palpation with TPDN    Joint Mobilization  RT UPA C-3 to C-7 grade 2/3, RT first rib sup/inf grade 2/3    Soft tissue mobilization  cervical and thoracic paraspinals RT scalenes, upper trap and levator, rhomboids sub scap   bil   Myofascial Release  RT upper tra and levator, rhomobids and sub scap   Rt only   Manual Traction  cervcial distraction, sub occiptial release      Neck Exercises: Stretches   Neck Stretch  5 reps;20 seconds   deep flexors   Other Neck Stretches  scalene stretch 3 x 30 sec with correct execution       Trigger Point Dry Needling - 11/26/19 0001    Consent Given?  Yes    Education Handout Provided  Previously provided    Muscles Treated Head and Neck  Upper trapezius;Levator scapulae   RT side only except otherwise noted   Muscles Treated Upper Quadrant  Subscapularis   RT side only   Dry Needling Comments  40 mm .25 gage    Upper Trapezius Response  Twitch  reponse elicited;Palpable increased muscle length    Suboccipitals Response  Palpable increased muscle length    Levator Scapulae Response  Twitch response elicited;Palpable increased muscle length    Cervical multifidi Response  Twitch reponse elicited;Palpable increased muscle length   C--4 C7T-1 and T2   Subscapularis Response  Twitch response elicited;Palpable increased muscle length             PT Short Term Goals - 10/14/19 1238      PT SHORT TERM GOAL #1   Title  He will be independent with initial HEp    Baseline  independent with HEP though needs some cues to initiate    Status  Achieved      PT SHORT TERM GOAL #2   Title  He will demo understanding of good  posture    Baseline  He reports being more mindful of posture. Needs cues in clinic but less now    Status  Achieved      PT SHORT TERM GOAL #3   Title  10% decreased in pain    Baseline  he reports he is 10% better with movment and pain    Status  Achieved        PT Long Term Goals - 11/26/19 1408      PT LONG TERM  GOAL #1   Title  He will be indpendent with all hEP issued    Baseline  given HEP today to master for last visit next week    Time  2    Period  Weeks    Status  On-going      PT LONG TERM GOAL #2   Title  He will report pain as intermittant with work and home tasks    Baseline  pt is now intermittent, only when he moves and rotates to RT only    Time  2    Period  Weeks    Status  Achieved      PT LONG TERM GOAL #3   Title  He will return to normal sleep without medication need    Baseline  no longer using medications to sleep, only using chamomille tea    Time  2    Period  Weeks    Status  Achieved      PT LONG TERM GOAL #4   Title  shoulder motion will be WNL bilaterally with no pain    Baseline  Movement is still limited compared to passive but significantly improved, and when he rotates to the RT    Time  2    Period  Weeks    Status  On-going            Plan - 11/26/19 1339    Clinical Impression Statement  LTG # 2 and 3 achieved.  Pt is able to sleep without medication. and is now able to report intermittent pain rather than constant.  Pt seems to respond well to TPDN and joint mobilization of RT 1st rib/cervical.  Pt has been given all HEP and will now spend next week trying to reinforce before next visit and DC    Personal Factors and Comorbidities  Time since onset of injury/illness/exacerbation    Examination-Activity Limitations  Lift;Stand;Sleep;Sit;Reach Overhead    Examination-Participation Restrictions  Yard Work;Community Activity    Stability/Clinical Decision Making  Stable/Uncomplicated    Clinical Decision Making  Low    Rehab Potential  Good    PT Frequency  2x / week  PT Duration  2 weeks    PT Treatment/Interventions  Dry needling;Manual techniques;Patient/family education;Therapeutic activities;Therapeutic exercise;Electrical Stimulation;Traction;Moist Heat;Iontophoresis 4mg /ml Dexamethasone;Ultrasound;Passive range of motion;Taping    PT Next  Visit Plan  DC next visit so AROM neck /goals etc    PT Home Exercise Plan  deep neck flexor with towel roll, shoulder flexion up wall and cervical retraction and rotation, added LTR and yellow band horizontal abduction, thoracic rotation with cross body shoulder stretch, supine toracic extension with deep breathing , over chair back thoracic ext. standing Blue t band bil flex/abd and scaption    Consulted and Agree with Plan of Care  Patient       Patient will benefit from skilled therapeutic intervention in order to improve the following deficits and impairments:  Postural dysfunction, Increased muscle spasms, Pain, Decreased activity tolerance, Decreased range of motion  Visit Diagnosis: Stiffness of neck  Chronic bilateral thoracic back pain  Cervicalgia  Other muscle spasm  Abnormal posture     Problem List There are no problems to display for this patient.   , PT Certified Exercise Expert for the Aging Adult  11/26/19 2:21 PM Phone: [phone] Fax: [phone]  [facility] [address] [place], [place], [place] Phone: [phone]   Fax:  [phone]  Name: Keith Valdez MRN: [name] Date of Birth: [birth year]

## 2019-11-28 ENCOUNTER — Ambulatory Visit: Payer: Medicaid Other | Admitting: Physical Therapy

## 2019-11-28 ENCOUNTER — Encounter: Payer: Self-pay | Admitting: Physical Therapy

## 2019-11-28 ENCOUNTER — Other Ambulatory Visit: Payer: Self-pay

## 2019-11-28 DIAGNOSIS — G8929 Other chronic pain: Secondary | ICD-10-CM | POA: Diagnosis not present

## 2019-11-28 DIAGNOSIS — R293 Abnormal posture: Secondary | ICD-10-CM | POA: Diagnosis not present

## 2019-11-28 DIAGNOSIS — M436 Torticollis: Secondary | ICD-10-CM

## 2019-11-28 DIAGNOSIS — M62838 Other muscle spasm: Secondary | ICD-10-CM | POA: Diagnosis not present

## 2019-11-28 DIAGNOSIS — M542 Cervicalgia: Secondary | ICD-10-CM | POA: Diagnosis not present

## 2019-11-28 DIAGNOSIS — M546 Pain in thoracic spine: Secondary | ICD-10-CM | POA: Diagnosis not present
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
## 2019-12-03 ENCOUNTER — Ambulatory Visit: Payer: Medicaid Other | Admitting: Physical Therapy

## 2019-12-28 ENCOUNTER — Encounter: Payer: Self-pay | Admitting: Family Medicine

## 2019-12-30 ENCOUNTER — Ambulatory Visit: Payer: Medicaid Other | Admitting: Family Medicine
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
## 2020-01-03 ENCOUNTER — Encounter: Payer: Self-pay | Admitting: Family

## 2020-01-03 ENCOUNTER — Other Ambulatory Visit: Payer: Self-pay

## 2020-01-03 ENCOUNTER — Ambulatory Visit: Payer: Medicaid Other | Attending: Family Medicine | Admitting: Family

## 2020-01-03 VITALS — BP 138/85 | HR 93 | Temp 97.9°F | Ht 72.0 in | Wt 168.4 lb

## 2020-01-03 DIAGNOSIS — M5416 Radiculopathy, lumbar region: Secondary | ICD-10-CM | POA: Insufficient documentation

## 2020-01-03 DIAGNOSIS — N529 Male erectile dysfunction, unspecified: Secondary | ICD-10-CM | POA: Insufficient documentation

## 2020-01-03 DIAGNOSIS — G8929 Other chronic pain: Secondary | ICD-10-CM | POA: Diagnosis not present

## 2020-01-03 DIAGNOSIS — M542 Cervicalgia: Secondary | ICD-10-CM | POA: Diagnosis not present

## 2020-01-03 DIAGNOSIS — E1142 Type 2 diabetes mellitus with diabetic polyneuropathy: Secondary | ICD-10-CM | POA: Diagnosis not present

## 2020-01-03 DIAGNOSIS — Z79899 Other long term (current) drug therapy: Secondary | ICD-10-CM | POA: Insufficient documentation

## 2020-01-03 DIAGNOSIS — Z833 Family history of diabetes mellitus: Secondary | ICD-10-CM | POA: Diagnosis not present

## 2020-01-03 DIAGNOSIS — Z7984 Long term (current) use of oral hypoglycemic drugs: Secondary | ICD-10-CM | POA: Insufficient documentation

## 2020-01-03 DIAGNOSIS — Z1211 Encounter for screening for malignant neoplasm of colon: Secondary | ICD-10-CM | POA: Diagnosis not present

## 2020-01-03 DIAGNOSIS — Z7689 Persons encountering health services in other specified circumstances: Secondary | ICD-10-CM

## 2020-01-03 LAB — POCT URINALYSIS DIP (CLINITEK)
Bilirubin, UA: NEGATIVE
Blood, UA: NEGATIVE
Glucose, UA: 500 mg/dL — AB
Leukocytes, UA: NEGATIVE
Nitrite, UA: NEGATIVE
POC PROTEIN,UA: NEGATIVE
Spec Grav, UA: 1.015 (ref 1.010–1.025)
Urobilinogen, UA: 0.2 E.U./dL
pH, UA: 5.5 (ref 5.0–8.0)

## 2020-01-03 LAB — GLUCOSE, POCT (MANUAL RESULT ENTRY): POC Glucose: 513 mg/dl — AB (ref 70–99)

## 2020-01-03 MED ORDER — JANUVIA 100 MG PO TABS: 100.0000 mg | ORAL_TABLET | Freq: Every day | ORAL | 2 refills | Status: DC

## 2020-01-03 MED ORDER — TRUE METRIX BLOOD GLUCOSE TEST VI STRP: ORAL_STRIP | 12 refills | Status: AC

## 2020-01-03 MED ORDER — INSULIN ASPART 100 UNIT/ML ~~LOC~~ SOLN
10.0000 [IU] | Freq: Once | SUBCUTANEOUS | Status: AC
  Administered 2020-01-03: 10 [IU] via SUBCUTANEOUS

## 2020-01-03 MED ORDER — GLIPIZIDE 10 MG PO TABS: 10.0000 mg | ORAL_TABLET | Freq: Two times a day (BID) | ORAL | 2 refills | Status: DC

## 2020-01-03 MED ORDER — TRUEPLUS LANCETS 28G MISC: 4 refills | Status: AC

## 2020-01-03 MED ORDER — GABAPENTIN 300 MG PO CAPS: 300.0000 mg | ORAL_CAPSULE | Freq: Every day | ORAL | 2 refills | Status: DC

## 2020-01-03 MED ORDER — TRUE METRIX METER W/DEVICE KIT: PACK | 0 refills | Status: AC

## 2020-01-03 NOTE — Patient Instructions (Addendum)
Januvia, Glipizide, and Gabapentin for diabetes. Follow-up in 2 weeks with clinical pharmacist for diabetes. Follow-up in 3 months with primary physician for management of chronic conditions. Diabetes Basics  Diabetes (diabetes mellitus) is a long-term (chronic) disease. It occurs when the body does not properly use sugar (glucose) that is released from food after you eat. Diabetes may be caused by one or both of these problems:  Your pancreas does not make enough of a hormone called insulin.  Your body does not react in a normal way to insulin that it makes. Insulin lets sugars (glucose) go into cells in your body. This gives you energy. If you have diabetes, sugars cannot get into cells. This causes high blood sugar (hyperglycemia). Follow these instructions at home: How is diabetes treated? You may need to take insulin or other diabetes medicines daily to keep your blood sugar in balance. Take your diabetes medicines every day as told by your doctor. List your diabetes medicines here: Diabetes medicines  Name of medicine: ______________________________ ? Amount (dose): _______________ Time (a.m./p.m.): _______________ Notes: ___________________________________  Name of medicine: ______________________________ ? Amount (dose): _______________ Time (a.m./p.m.): _______________ Notes: ___________________________________  Name of medicine: ______________________________ ? Amount (dose): _______________ Time (a.m./p.m.): _______________ Notes: ___________________________________ If you use insulin, you will learn how to give yourself insulin by injection. You may need to adjust the amount based on the food that you eat. List the types of insulin you use here: Insulin  Insulin type: ______________________________ ? Amount (dose): _______________ Time (a.m./p.m.): _______________ Notes: ___________________________________  Insulin type: ______________________________ ? Amount (dose):  _______________ Time (a.m./p.m.): _______________ Notes: ___________________________________  Insulin type: ______________________________ ? Amount (dose): _______________ Time (a.m./p.m.): _______________ Notes: ___________________________________  Insulin type: ______________________________ ? Amount (dose): _______________ Time (a.m./p.m.): _______________ Notes: ___________________________________  Insulin type: ______________________________ ? Amount (dose): _______________ Time (a.m./p.m.): _______________ Notes: ___________________________________ How do I manage my blood sugar?  Check your blood sugar levels using a blood glucose monitor as directed by your doctor. Your doctor will set treatment goals for you. Generally, you should have these blood sugar levels:  Before meals (preprandial): 80-130 mg/dL (4.4-7.2 mmol/L).  After meals (postprandial): below 180 mg/dL (10 mmol/L).  A1c level: less than 7%. Write down the times that you will check your blood sugar levels: Blood sugar checks  Time: _______________ Notes: ___________________________________  Time: _______________ Notes: ___________________________________  Time: _______________ Notes: ___________________________________  Time: _______________ Notes: ___________________________________  Time: _______________ Notes: ___________________________________  Time: _______________ Notes: ___________________________________  What do I need to know about low blood sugar? Low blood sugar is called hypoglycemia. This is when blood sugar is at or below 70 mg/dL (3.9 mmol/L). Symptoms may include:  Feeling: ? Hungry. ? Worried or nervous (anxious). ? Sweaty and clammy. ? Confused. ? Dizzy. ? Sleepy. ? Sick to your stomach (nauseous).  Having: ? A fast heartbeat. ? A headache. ? A change in your vision. ? Tingling or no feeling (numbness) around the mouth, lips, or tongue. ? Jerky movements that you cannot  control (seizure).  Having trouble with: ? Moving (coordination). ? Sleeping. ? Passing out (fainting). ? Getting upset easily (irritability). Treating low blood sugar To treat low blood sugar, eat or drink something sugary right away. If you can think clearly and swallow safely, follow the 15:15 rule:  Take 15 grams of a fast-acting carb (carbohydrate). Talk with your doctor about how much you should take.  Some fast-acting carbs are: ? Sugar tablets (glucose pills). Take 3-4 glucose pills. ? 6-8 pieces of hard candy. ?  4-6 oz (120-150 mL) of fruit juice. ? 4-6 oz (120-150 mL) of regular (not diet) soda. ? 1 Tbsp (15 mL) honey or sugar.  Check your blood sugar 15 minutes after you take the carb.  If your blood sugar is still at or below 70 mg/dL (3.9 mmol/L), take 15 grams of a carb again.  If your blood sugar does not go above 70 mg/dL (3.9 mmol/L) after 3 tries, get help right away.  After your blood sugar goes back to normal, eat a meal or a snack within 1 hour. Treating very low blood sugar If your blood sugar is at or below 54 mg/dL (3 mmol/L), you have very low blood sugar (severe hypoglycemia). This is an emergency. Do not wait to see if the symptoms will go away. Get medical help right away. Call your local emergency services (911 in the [place] Do not drive yourself to the hospital. Questions to ask your health care provider  Do I need to meet with a diabetes educator?  What equipment will I need to care for myself at home?  What diabetes medicines do I need? When should I take them?  How often do I need to check my blood sugar?  What number can I call if I have questions?  When is my next doctor's visit?  Where can I find a support group for people with diabetes? Where to find more information  American Diabetes Association: www.diabetes.org  American Association of Diabetes Educators: www.diabeteseducator.org/patient-resources Contact a doctor if:  Your  blood sugar is at or above 240 mg/dL (25.0 mmol/L) for 2 days in a row.  You have been sick or have had a fever for 2 days or more, and you are not getting better.  You have any of these problems for more than 6 hours: ? You cannot eat or drink. ? You feel sick to your stomach (nauseous). ? You throw up (vomit). ? You have watery poop (diarrhea). Get help right away if:  Your blood sugar is lower than 54 mg/dL (3 mmol/L).  You get confused.  You have trouble: ? Thinking clearly. ? Breathing. Summary  Diabetes (diabetes mellitus) is a long-term (chronic) disease. It occurs when the body does not properly use sugar (glucose) that is released from food after digestion.  Take insulin and diabetes medicines as told.  Check your blood sugar every day, as often as told.  Keep all follow-up visits as told by your doctor. This is important. This information is not intended to replace advice given to you by your health care provider. Make sure you discuss any questions you have with your health care provider. Document Revised: 05/22/2019 Document Reviewed: 12/01/2017 Elsevier Patient Education  2020 [employer].

## 2020-01-03 NOTE — Progress Notes (Signed)
Right shoulder pain and need P.T  D.M  Med refills for blood sugar meds (glipizide and Januvia)  Numbness in fingers and toes  E.D Problems  CBG today is 512

## 2020-01-03 NOTE — Progress Notes (Signed)
Urinalysis dip positive for sugar and small ketones. Pending microalbumin/creatinine ratio.  CBG addressed in clinic.

## 2020-01-04 LAB — MICROALBUMIN / CREATININE URINE RATIO
Creatinine, Urine: 45.6 mg/dL
Microalb/Creat Ratio: <7 mg/g creat (ref 0–29)
Microalbumin, Urine: <3 ug/mL

## 2020-01-06 ENCOUNTER — Telehealth: Payer: Self-pay | Admitting: *Deleted

## 2020-01-06 LAB — CBC WITH DIFFERENTIAL
Basophils Absolute: 0.1 10*3/uL (ref 0.0–0.2)
Basos: 1 %
EOS (ABSOLUTE): 0.1 10*3/uL (ref 0.0–0.4)
Eos: 3 %
Hematocrit: 45.6 % (ref 37.5–51.0)
Hemoglobin: 15.5 g/dL (ref 13.0–17.7)
Immature Grans (Abs): 0 10*3/uL (ref 0.0–0.1)
Immature Granulocytes: 0 %
Lymphocytes Absolute: 1.8 10*3/uL (ref 0.7–3.1)
Lymphs: 34 %
MCH: 29.7 pg (ref 26.6–33.0)
MCHC: 34 g/dL (ref 31.5–35.7)
MCV: 87 fL (ref 79–97)
Monocytes Absolute: 0.5 10*3/uL (ref 0.1–0.9)
Monocytes: 10 %
Neutrophils Absolute: 2.8 10*3/uL (ref 1.4–7.0)
Neutrophils: 52 %
RBC: 5.22 x10E6/uL (ref 4.14–5.80)
RDW: 11.9 % (ref 11.6–15.4)
WBC: 5.3 10*3/uL (ref 3.4–10.8)

## 2020-01-06 LAB — CMP14+EGFR
ALT: 31 IU/L (ref 0–44)
AST: 25 IU/L (ref 0–40)
Albumin/Globulin Ratio: 2.1 (ref 1.2–2.2)
Albumin: 4.8 g/dL (ref 3.8–4.8)
Alkaline Phosphatase: 263 IU/L — ABNORMAL HIGH (ref 39–117)
BUN/Creatinine Ratio: 16 (ref 10–24)
BUN: 21 mg/dL (ref 8–27)
Bilirubin Total: 0.4 mg/dL (ref 0.0–1.2)
CO2: 23 mmol/L (ref 20–29)
Calcium: 10.2 mg/dL (ref 8.6–10.2)
Chloride: 94 mmol/L — ABNORMAL LOW (ref 96–106)
Creatinine, Ser: 1.31 mg/dL — ABNORMAL HIGH (ref 0.76–1.27)
GFR calc Af Amer: 67 mL/min/{1.73_m2} (ref >59)
GFR calc non Af Amer: 58 mL/min/{1.73_m2} — ABNORMAL LOW (ref >59)
Globulin, Total: 2.3 g/dL (ref 1.5–4.5)
Glucose: 487 mg/dL — ABNORMAL HIGH (ref 65–99)
Potassium: 4.9 mmol/L (ref 3.5–5.2)
Sodium: 136 mmol/L (ref 134–144)
Total Protein: 7.1 g/dL (ref 6.0–8.5)

## 2020-01-06 LAB — HIV ANTIBODY (ROUTINE TESTING W REFLEX): HIV Screen 4th Generation wRfx: NONREACTIVE

## 2020-01-06 LAB — HEMOGLOBIN A1C
Est. average glucose Bld gHb Est-mCnc: >398 mg/dL
Hgb A1c MFr Bld: >15.5 % — ABNORMAL HIGH (ref 4.8–5.6)

## 2020-01-06 MED ORDER — GLIPIZIDE 10 MG PO TABS: 10.0000 mg | ORAL_TABLET | Freq: Two times a day (BID) | ORAL | 2 refills | Status: DC

## 2020-01-06 NOTE — Addendum Note (Signed)
Addended by: Rema Fendt on: 01/06/2020 02:21 PM   Modules accepted: Orders

## 2020-01-06 NOTE — Telephone Encounter (Signed)
Patient stated that his Glipizide was not sent to the pharmacy. Chart is showing it was printed. Message will be sent to provider to resend script.

## 2020-01-06 NOTE — Progress Notes (Signed)
Microalbumin/creatinine ration normal meaning kidney function is good.   Blood count normal meaning no anemia.  HIV negative.  A1C and urinalysis with reflex pending.  Point-of-care glucose and urinalysis discussed in clinic.

## 2020-01-07 ENCOUNTER — Telehealth: Payer: Self-pay | Admitting: Family Medicine

## 2020-01-07 DIAGNOSIS — E1142 Type 2 diabetes mellitus with diabetic polyneuropathy: Secondary | ICD-10-CM

## 2020-01-07 MED ORDER — GLIPIZIDE 10 MG PO TABS: 10.0000 mg | ORAL_TABLET | Freq: Two times a day (BID) | ORAL | 2 refills | Status: DC

## 2020-01-07 NOTE — Telephone Encounter (Signed)
Patient needs PA on JANUVIA 100 MG tablet Please f/u

## 2020-01-07 NOTE — Telephone Encounter (Signed)
Patient called saying that his pharmacy never received his glipiZIDE (GLUCOTROL) 10 MG tablet Rx. It looks that the Rx was printed but the patient never received it. Can you please send it electronically.

## 2020-01-07 NOTE — Telephone Encounter (Signed)
Prior auth approved thru 01/06/21.  Will call pharmacy and tell them ok to fill.

## 2020-01-09 NOTE — Progress Notes (Signed)
Urinalysis results glucose and ketones in urine. Patient not agreeable to adding Lantus to help with diabetes management. Patient has not adhered to regimen of diabetes medication for the past 5 months. Follow-up with primary physician as scheduled.

## 2020-01-10 ENCOUNTER — Telehealth: Payer: Self-pay | Admitting: Family Medicine

## 2020-01-10 NOTE — Telephone Encounter (Signed)
Called patient to schedule a 4 week f/u appt with PCP. Voice mail was full.

## 2020-01-31 ENCOUNTER — Other Ambulatory Visit: Payer: Self-pay

## 2020-01-31 ENCOUNTER — Ambulatory Visit: Payer: Medicaid Other | Attending: Family Medicine | Admitting: Family Medicine

## 2020-01-31 ENCOUNTER — Encounter: Payer: Self-pay | Admitting: Family Medicine

## 2020-01-31 VITALS — BP 147/87 | HR 74 | Temp 98.1°F | Ht 72.0 in | Wt 170.2 lb

## 2020-01-31 DIAGNOSIS — N529 Male erectile dysfunction, unspecified: Secondary | ICD-10-CM | POA: Diagnosis not present

## 2020-01-31 DIAGNOSIS — M5442 Lumbago with sciatica, left side: Secondary | ICD-10-CM | POA: Diagnosis not present

## 2020-01-31 DIAGNOSIS — E1165 Type 2 diabetes mellitus with hyperglycemia: Secondary | ICD-10-CM | POA: Diagnosis not present

## 2020-01-31 DIAGNOSIS — M5412 Radiculopathy, cervical region: Secondary | ICD-10-CM | POA: Diagnosis not present

## 2020-01-31 DIAGNOSIS — Z791 Long term (current) use of non-steroidal anti-inflammatories (NSAID): Secondary | ICD-10-CM | POA: Insufficient documentation

## 2020-01-31 DIAGNOSIS — M5416 Radiculopathy, lumbar region: Secondary | ICD-10-CM | POA: Diagnosis not present

## 2020-01-31 DIAGNOSIS — Z7984 Long term (current) use of oral hypoglycemic drugs: Secondary | ICD-10-CM | POA: Insufficient documentation

## 2020-01-31 DIAGNOSIS — M5441 Lumbago with sciatica, right side: Secondary | ICD-10-CM | POA: Diagnosis not present

## 2020-01-31 DIAGNOSIS — M549 Dorsalgia, unspecified: Secondary | ICD-10-CM | POA: Diagnosis not present

## 2020-01-31 DIAGNOSIS — G8929 Other chronic pain: Secondary | ICD-10-CM

## 2020-01-31 DIAGNOSIS — R030 Elevated blood-pressure reading, without diagnosis of hypertension: Secondary | ICD-10-CM

## 2020-01-31 DIAGNOSIS — M542 Cervicalgia: Secondary | ICD-10-CM | POA: Diagnosis not present

## 2020-01-31 DIAGNOSIS — Z79899 Other long term (current) drug therapy: Secondary | ICD-10-CM | POA: Insufficient documentation

## 2020-01-31 DIAGNOSIS — E1142 Type 2 diabetes mellitus with diabetic polyneuropathy: Secondary | ICD-10-CM | POA: Diagnosis not present

## 2020-01-31 LAB — GLUCOSE, POCT (MANUAL RESULT ENTRY): POC Glucose: 254 mg/dL — AB (ref 70–99)

## 2020-01-31 NOTE — Progress Notes (Signed)
DM f/u  

## 2020-01-31 NOTE — Patient Instructions (Addendum)
A referral has been placed for you to see a diabetes specialist also called an endocrinologist in follow-up of your uncontrolled type 2 diabetes.  Please call this office if no one has contacted you within the next 2 weeks regarding a follow-up appointment with endocrinology/orthopedics/neurology urology.  You have also been scheduled to follow-up with specialists regarding your chronic neck and back pain, neuropathy as well as follow-up with a urologist.    Hyperglycemia Hyperglycemia is when the sugar (glucose) level in your blood is too high. It may not cause symptoms. If you do have symptoms, they may include warning signs, such as:  Feeling more thirsty than normal.  Hunger.  Feeling tired.  Needing to pee (urinate) more than normal.  Blurry eyesight (vision). You may get other symptoms as it gets worse, such as:  Dry mouth.  Not being hungry (loss of appetite).  Fruity-smelling breath.  Weakness.  Weight gain or loss that is not planned. Weight loss may be fast.  A tingling or numb feeling in your hands or feet.  Headache.  Skin that does not bounce back quickly when it is lightly pinched and released (poor skin turgor).  Pain in your belly (abdomen).  Cuts or bruises that heal slowly. High blood sugar can happen to people who do or do not have diabetes. High blood sugar can happen slowly or quickly, and it can be an emergency. Follow these instructions at home: General instructions  Take over-the-counter and prescription medicines only as told by your doctor.  Do not use products that contain nicotine or tobacco, such as cigarettes and e-cigarettes. If you need help quitting, ask your doctor.  Limit alcohol intake to no more than 1 drink per day for nonpregnant women and 2 drinks per day for men. One drink equals 12 oz of beer, 5 oz of wine, or 1 oz of hard liquor.  Manage stress. If you need help with this, ask your doctor.  Keep all follow-up visits as told  by your doctor. This is important. Eating and drinking   Stay at a healthy weight.  Exercise regularly, as told by your doctor.  Drink enough fluid, especially when you: ? Exercise. ? Get sick. ? Are in hot temperatures.  Eat healthy foods, such as: ? Low-fat (lean) proteins. ? Complex carbs (complex carbohydrates), such as whole wheat bread or brown rice. ? Fresh fruits and vegetables. ? Low-fat dairy products. ? Healthy fats.  Drink enough fluid to keep your pee (urine) clear or pale yellow. If you have diabetes:   Make sure you know the symptoms of hyperglycemia.  Follow your diabetes management plan, as told by your doctor. Make sure you: ? Take insulin and medicines as told. ? Follow your exercise plan. ? Follow your meal plan. Eat on time. Do not skip meals. ? Check your blood sugar as often as told. Make sure to check before and after exercise. If you exercise longer or in a different way than you normally do, check your blood sugar more often. ? Follow your sick day plan whenever you cannot eat or drink normally. Make this plan ahead of time with your doctor.  Share your diabetes management plan with people in your workplace, school, and household.  Check your urine for ketones when you are ill and as told by your doctor.  Carry a card or wear jewelry that says that you have diabetes. Contact a doctor if:  Your blood sugar level is higher than 240 mg/dL (13.3 mmol/L)  for 2 days in a row.  You have problems keeping your blood sugar in your target range.  High blood sugar happens often for you. Get help right away if:  You have trouble breathing.  You have a change in how you think, feel, or act (mental status).  You feel sick to your stomach (nauseous), and that feeling does not go away.  You cannot stop throwing up (vomiting). These symptoms may be an emergency. Do not wait to see if the symptoms will go away. Get medical help right away. Call your local  emergency services (911 in the [place] Do not drive yourself to the hospital. Summary  Hyperglycemia is when the sugar (glucose) level in your blood is too high.  High blood sugar can happen to people who do or do not have diabetes.  Make sure you drink enough fluids, eat healthy foods, and exercise regularly.  Contact your doctor if you have problems keeping your blood sugar in your target range. This information is not intended to replace advice given to you by your health care provider. Make sure you discuss any questions you have with your health care provider. Document Revised: 05/16/2016 Document Reviewed: 05/16/2016 Elsevier Patient Education  [place].  Correction Insulin  Correction insulin, also called corrective insulin or a supplemental dose, is a small amount of insulin that can be used to lower your blood sugar (glucose) if it is too high. You may be instructed to check your blood glucose at certain times of the day and to use correction insulin as needed to lower your blood glucose to your target range. Correction insulin is primarily used as part of diabetes management. It may also be prescribed for people who do not have diabetes. What is a correction scale? A correction scale, also called a sliding scale, is prescribed by your health care provider to help you determine when you need correction insulin. Your correction scale is based on your individual treatment goals, and it has two parts:  Ranges of blood glucose levels.  How much correction insulin to give yourself if your blood sugar falls within a certain range. If your blood glucose is in your desired range, you will not need correction insulin and you should take your normal insulin dose. What type of insulin do I need? Your health care provider may prescribe rapid-acting or short-acting insulin for you to use as correction insulin. Rapid-acting insulin:  Starts working quickly, in as little as 5  minutes.  Can last for 3-6 hours.  Works well when taken right before a meal to quickly lower blood glucose. Short-acting insulin:  Starts working in about 30 minutes.  Can last for 6-8 hours.  Should be taken about 30 minutes before you start eating a meal. Talk with your health care provider or pharmacist about which type of correction insulin to take and when to take it. If you use insulin to control your diabetes, you should use correction insulin in addition to the longer-acting (basal) insulin that you normally use. How do I manage my blood glucose with correction insulin? Giving a correction dose  Check your blood glucose as directed by your health care provider.  Use your correction scale to find the range that your blood glucose is in.  Identify the units of insulin that match your blood glucose range.  Make sure you have food available that you can eat in the next 15-30 minutes, after your correction dose.  Give yourself the dose of correction  insulin that your health care provider has prescribed in your correction scale. Always make sure you are using the right type of insulin. ? If your correction insulin is rapid-acting, start eating a meal within 15 minutes after your correction dose to keep your blood glucose from getting too low. ? If your correction insulin is short-acting, start eating a meal within 30 minutes after your correction dose to keep your blood glucose from getting too low. Keeping a blood glucose log   Write down your blood glucose test results and the amount of insulin that you give yourself. Do this every time you check blood glucose or take insulin. Bring this log with you to your medical visits. This information will help your health care provider to manage your medicines.  Note anything that may affect your blood glucose, such as: ? Changes in normal exercise or activity. ? Changes in your normal schedule, such as changes in your sleep routine,  going on vacation, changing your diet, or holidays. ? New over-the-counter or prescription medicines. ? Illness, stress, or anxiety. ? Changes in the time that you took your medicine or insulin. ? Changes in your meals, such as skipping a meal, having a late meal, or dining out. ? Eating things that may affect blood glucose, such as snacks, meal portions that are larger than normal, drinks that contain sugar, or eating less than usual. What do I need to know about hyperglycemia and hypoglycemia? What is hyperglycemia? Hyperglycemia, also called high blood glucose, occurs when blood glucose is too high. Make sure you know the early signs of hyperglycemia, such as:  Increased thirst.  Hunger.  Feeling very tired.  Needing to urinate more often than usual.  Blurry vision. What is hypoglycemia? Hypoglycemia is also called low blood glucose. Be aware of "stacking" your insulin doses. This happens when you correct a high blood glucose by giving yourself extra insulin too soon after a previous correction dose or mealtime dose. This may cause you to have too much insulin in your body and may put you at risk for hypoglycemia. Hypoglycemia occurs with a blood glucose level at or below 70 mg/dL (3.9 mmol/L). It is important to know the symptoms of hypoglycemia and treat it right away. Always have a 15-gram rapid-acting carbohydrate snack with you to treat low blood glucose. Family members and close friends should also know the symptoms and should understand how to treat hypoglycemia, in case you are not able to treat yourself. What are the symptoms of hypoglycemia? Hypoglycemia symptoms can include:  Hunger.  Anxiety.  Sweating and feeling clammy.  Confusion.  Dizziness or light-headedness.  Sleepiness.  Nausea.  Increased heart rate.  Headache.  Blurry vision.  Jerky movements that you cannot control (seizure).  Nightmares.  Tingling or numbness around the mouth, lips, or  tongue.  A change in speech.  Decreased ability to concentrate.  A change in coordination.  Restless sleep.  Tremors or shakes.  Fainting.  Irritability. How do I treat hypoglycemia? If you are alert and able to swallow safely, follow the 15:15 rule:  Take 15 grams of a rapid-acting carbohydrate. Rapid-acting options include: ? 1 tube of glucose gel. ? 3 glucose pills. ? 6-8 pieces of hard candy. ? 4 oz (120 mL) of fruit juice. ? 4 oz (120 mL) of regular (not diet) soda.  Check your blood glucose 15 minutes after you take the carbohydrate. ? If the repeat blood glucose level is still at or below 70 mg/dL (3.9  mmol/L), take 15 grams of a carbohydrate again. ? If your blood glucose level does not increase above 70 mg/dL (3.9 mmol/L) after 3 tries, seek emergency medical care.  After your blood glucose level returns to normal, eat a meal or a snack within 1 hour.  How do I treat severe hypoglycemia? Severe hypoglycemia is when your blood glucose level is at or below 54 mg/dL (3 mmol/L). Severe hypoglycemia is an emergency. Do not wait to see if the symptoms will go away. Get medical help right away. Call your local emergency services (911 in the [place] Do not drive yourself to the hospital. If you have severe hypoglycemia and you cannot eat or drink, you may need an injection of glucagon. A family member or close friend should learn how to check your blood glucose and how to give you a glucagon injection. Ask your health care provider if you need to have an emergency glucagon injection kit available. Severe hypoglycemia may need to be treated in a hospital. The treatment may include getting glucose through an IV tube. You may also need treatment for the cause of your hypoglycemia. Why do I need correction insulin if I do not have diabetes? If you do not have diabetes, your health care provider may prescribe insulin because:  Keeping your blood glucose in the target range is  important for your overall health.  You are taking medicines that cause your blood glucose to be higher than normal. Contact a health care provider if:  You develop low blood glucose that you are not able to treat yourself.  You have high blood glucose that you are not able to correct with correction insulin.  Your blood glucose is often too low.  You used emergency glucagon to treat low blood glucose. Get help right away if:  You become unresponsive. If this happens, someone else should call emergency services (911 in the [place] right away.  Your blood glucose is lower than 54 mg/dL (3.0 mmol/L).  You become confused or you have trouble thinking clearly.  You have difficulty breathing. Summary  Correction insulin is a small amount of insulin that can be used to lower your blood sugar (glucose) if it is too high.  Talk with your health care provider or pharmacist about which type of correction insulin to take and when to take it. If you use insulin to control your diabetes, you should use correction insulin in addition to the longer-acting (basal) insulin that you normally use.  You may be instructed to check your blood glucose at certain times of the day and to use correction insulin as needed to lower your blood glucose to your target range. Always keep a log of your blood glucose values and the amount of insulin that you used.  It is important to know the symptoms of hypoglycemia and treat it right away. Always have a 15-gram rapid-acting carbohydrate snack with you to treat low blood glucose. Family members and close friends should also know the symptoms and should understand how to treat hypoglycemia, in case you are not able to treat yourself. This information is not intended to replace advice given to you by your health care provider. Make sure you discuss any questions you have with your health care provider. Document Revised: 09/08/2016 Document Reviewed: 05/27/2016 Elsevier  Patient Education  [place].  Erectile Dysfunction Erectile dysfunction (ED) is the inability to get or keep an erection in order to have sexual intercourse. Erectile dysfunction may include:  Inability  to get an erection.  Lack of enough hardness of the erection to allow penetration.  Loss of the erection before sex is finished. What are the causes? This condition may be caused by:  Certain medicines, such as: ? Pain relievers. ? Antihistamines. ? Antidepressants. ? Blood pressure medicines. ? Water pills (diuretics). ? Ulcer medicines. ? Muscle relaxants. ? Drugs.  Excessive drinking.  Psychological causes, such as: ? Anxiety. ? Depression. ? Sadness. ? Exhaustion. ? Performance fear. ? Stress.  Physical causes, such as: ? Artery problems. This may include diabetes, smoking, liver disease, or atherosclerosis. ? High blood pressure. ? Hormonal problems, such as low testosterone. ? Obesity. ? Nerve problems. This may include back or pelvic injuries, diabetes mellitus, multiple sclerosis, or Parkinson disease. What are the signs or symptoms? Symptoms of this condition include:  Inability to get an erection.  Lack of enough hardness of the erection to allow penetration.  Loss of the erection before sex is finished.  Normal erections at some times, but with frequent unsatisfactory episodes.  Low sexual satisfaction in either partner due to erection problems.  A curved penis occurring with erection. The curve may cause pain or the penis may be too curved to allow for intercourse.  Never having nighttime erections. How is this diagnosed? This condition is often diagnosed by:  Performing a physical exam to find other diseases or specific problems with the penis.  Asking you detailed questions about the problem.  Performing blood tests to check for diabetes mellitus or to measure hormone levels.  Performing other tests to check for underlying health  conditions.  Performing an ultrasound exam to check for scarring.  Performing a test to check blood flow to the penis.  Doing a sleep study at home to measure nighttime erections. How is this treated? This condition may be treated by:  Medicine taken by mouth to help you achieve an erection (oral medicine).  Hormone replacement therapy to replace low testosterone levels.  Medicine that is injected into the penis. Your health care provider may instruct you how to give yourself these injections at home.  Vacuum pump. This is a pump with a ring on it. The pump and ring are placed on the penis and used to create pressure that helps the penis become erect.  Penile implant surgery. In this procedure, you may receive: ? An inflatable implant. This consists of cylinders, a pump, and a reservoir. The cylinders can be inflated with a fluid that helps to create an erection, and they can be deflated after intercourse. ? A semi-rigid implant. This consists of two silicone rubber rods. The rods provide some rigidity. They are also flexible, so the penis can both curve downward in its normal position and become straight for sexual intercourse.  Blood vessel surgery, to improve blood flow to the penis. During this procedure, a blood vessel from a different part of the body is placed into the penis to allow blood to flow around (bypass) damaged or blocked blood vessels.  Lifestyle changes, such as exercising more, losing weight, and quitting smoking. Follow these instructions at home: Medicines   Take over-the-counter and prescription medicines only as told by your health care provider. Do not increase the dosage without first discussing it with your health care provider.  If you are using self-injections, perform injections as directed by your health care provider. Make sure to avoid any veins that are on the surface of the penis. After giving an injection, apply pressure to the injection  site for 5  minutes. General instructions  Exercise regularly, as directed by your health care provider. Work with your health care provider to lose weight, if needed.  Do not use any products that contain nicotine or tobacco, such as cigarettes and e-cigarettes. If you need help quitting, ask your health care provider.  Before using a vacuum pump, read the instructions that come with the pump and discuss any questions with your health care provider.  Keep all follow-up visits as told by your health care provider. This is important. Contact a health care provider if:  You feel nauseous.  You vomit. Get help right away if:  You are taking oral or injectable medicines and you have an erection that lasts longer than 4 hours. If your health care provider is unavailable, go to the nearest emergency room for evaluation. An erection that lasts much longer than 4 hours can result in permanent damage to your penis.  You have severe pain in your groin or abdomen.  You develop redness or severe swelling of your penis.  You have redness spreading up into your groin or lower abdomen.  You are unable to urinate.  You experience chest pain or a rapid heart beat (palpitations) after taking oral medicines. Summary  Erectile dysfunction (ED) is the inability to get or keep an erection during sexual intercourse. This problem can usually be treated successfully.  This condition is diagnosed based on a physical exam, your symptoms, and tests to determine the cause. Treatment varies depending on the cause, and may include medicines, hormone therapy, surgery, or vacuum pump.  You may need follow-up visits to make sure that you are using your medicines or devices correctly.  Get help right away if you are taking or injecting medicines and you have an erection that lasts longer than 4 hours. This information is not intended to replace advice given to you by your health care provider. Make sure you discuss any  questions you have with your health care provider. Document Revised: 08/11/2017 Document Reviewed: 09/14/2016 Elsevier Patient Education  [place].  Diabetic Neuropathy Diabetic neuropathy refers to nerve damage that is caused by diabetes (diabetes mellitus). Over time, people with diabetes can develop nerve damage throughout the body. There are several types of diabetic neuropathy:  Peripheral neuropathy. This is the most common type of diabetic neuropathy. It causes damage to nerves that carry signals between the spinal cord and other parts of the body (peripheral nerves). This usually affects nerves in the feet and legs first, and may eventually affect the hands and arms. The damage affects the ability to sense touch or temperature.  Autonomic neuropathy. This type causes damage to nerves that control involuntary functions (autonomic nerves). These nerves carry signals that control: ? Heartbeat. ? Body temperature. ? Blood pressure. ? Urination. ? Digestion. ? Sweating. ? Sexual function. ? Response to changing blood sugar (glucose) levels.  Focal neuropathy. This type of nerve damage affects one area of the body, such as an arm, a leg, or the face. The injury may involve one nerve or a small group of nerves. Focal neuropathy can be painful and unpredictable, and occurs most often in older adults with diabetes. This often develops suddenly, but usually improves over time and does not cause long-term problems.  Proximal neuropathy. This type of nerve damage affects the nerves of the thighs, hips, buttocks, or legs. It causes severe pain, weakness, and muscle death (atrophy), usually in the thigh muscles. It is more common among  older men and people who have type 2 diabetes. The length of recovery time may vary. What are the causes? Peripheral, autonomic, and focal neuropathies are caused by diabetes that is not well controlled with treatment. The cause of proximal neuropathy is not  known, but it may be caused by inflammation related to uncontrolled blood glucose levels. What are the signs or symptoms? Peripheral neuropathy Peripheral neuropathy develops slowly over time. When the nerves of the feet and legs no longer work, you may experience:  Burning, stabbing, or aching pain in the legs or feet.  Pain or cramping in the legs or feet.  Loss of feeling (numbness) and inability to feel pressure or pain in the feet. This can lead to: ? Thick calluses or sores on areas of constant pressure. ? Ulcers. ? Reduced ability to feel temperature changes.  Foot deformities.  Muscle weakness.  Loss of balance or coordination. Autonomic neuropathy The symptoms of autonomic neuropathy vary depending on which nerves are affected. Symptoms may include:  Problems with digestion, such as: ? Nausea or vomiting. ? Poor appetite. ? Bloating. ? Diarrhea or constipation. ? Trouble swallowing. ? Losing weight without trying to.  Problems with the heart, blood and lungs, such as: ? Dizziness, especially when standing up. ? Fainting. ? Shortness of breath. ? Irregular heartbeat.  Bladder problems, such as: ? Trouble starting or stopping urination. ? Leaking urine. ? Trouble emptying the bladder. ? Urinary tract infections (UTIs).  Problems with other body functions, such as: ? Sweat. You may sweat too much or too little. ? Temperature. You might get hot easily. Or, you might feel cold more than usual. ? Sexual function. Men may not be able to get or maintain an erection. Women may have vaginal dryness and difficulty with arousal. Focal neuropathy Symptoms affect only one area of the body. Common symptoms include:  Numbness.  Tingling.  Burning pain.  Prickling feeling.  Very sensitive skin.  Weakness.  Inability to move (paralysis).  Muscle twitching.  Muscles getting smaller (wasting).  Poor coordination.  Double or blurred vision. Proximal  neuropathy  Sudden, severe pain in the hip, thigh, or buttocks. Pain may spread from the back into the legs (sciatica).  Pain and numbness in the arms and legs.  Tingling.  Loss of bladder control or bowel control.  Weakness and wasting of thigh muscles.  Difficulty getting up from a seated position.  Abdominal swelling.  Unexplained weight loss. How is this diagnosed? Diagnosis usually involves reviewing your medical history and any symptoms you have. Diagnosis varies depending on the type of neuropathy your health care provider suspects. Peripheral neuropathy Your health care provider will check areas that are affected by your nervous system (neurologic exam), such as your reflexes, how you move, and what you can feel. You may have other tests, such as:  Blood tests.  Removal and examination of fluid that surrounds the spinal cord (lumbar puncture).  CT scan.  MRI.  A test to check the nerves that control muscles (electromyogram, EMG).  Tests of how quickly messages pass through your nerves (nerve conduction velocity tests).  Removal of a small piece of nerve to be examined under a microscope (biopsy). Autonomic neuropathy You may have tests, such as:  Tests to measure your blood pressure and heart rate. This may include monitoring you while you are safely secured to an exam table that moves you from a lying position to an upright position (table tilt test).  Breathing tests to check your  lungs.  Tests to check how food moves through the digestive system (gastric emptying tests).  Blood, sweat, or urine tests.  Ultrasound of your bladder.  Spinal fluid tests. Focal neuropathy This condition may be diagnosed with:  A neurologic exam.  CT scan.  MRI.  EMG.  Nerve conduction velocity tests. Proximal neuropathy There is no test to diagnose this type of neuropathy. You may have tests to rule out other possible causes of this type of neuropathy. Tests may  include:  X-rays of your spine and lumbar region.  Lumbar puncture.  MRI. How is this treated? The goal of treatment is to keep nerve damage from getting worse. The most important part of treatment is keeping your blood glucose level and your A1C level within your target range by following your diabetes management plan. Over time, maintaining lower blood glucose levels helps lessen symptoms. In some cases, you may need prescription pain medicine. Follow these instructions at home:  Lifestyle   Do not use any products that contain nicotine or tobacco, such as cigarettes and e-cigarettes. If you need help quitting, ask your health care provider.  Be physically active every day. Include strength training and balance exercises.  Follow a healthy meal plan.  Work with your health care provider to manage your blood pressure. General instructions  Follow your diabetes management plan as directed. ? Check your blood glucose levels as directed by your health care provider. ? Keep your blood glucose in your target range as directed by your health care provider. ? Have your A1C level checked at least two times a year, or as often as told by your health care provider.  Take over the counter and prescription medicines only as told by your health care provider. This includes insulin and diabetes medicine.  Do not drive or use heavy machinery while taking prescription pain medicines.  Check your skin and feet every day for cuts, bruises, redness, blisters, or sores.  Keep all follow up visits as told by your health care provider. This is important. Contact a health care provider if:  You have burning, stabbing, or aching pain in your legs or feet.  You are unable to feel pressure or pain in your feet.  You develop problems with digestion, such as: ? Nausea. ? Vomiting. ? Bloating. ? Constipation. ? Diarrhea. ? Abdominal pain.  You have difficulty with urination, such as  inability: ? To control when you urinate (incontinence). ? To completely empty the bladder (retention).  You have palpitations.  You feel dizzy, weak, or faint when you stand up. Get help right away if:  You cannot urinate.  You have sudden weakness or loss of coordination.  You have trouble speaking.  You have pain or pressure in your chest.  You have an irregular heart beat.  You have sudden inability to move a part of your body. Summary  Diabetic neuropathy refers to nerve damage that is caused by diabetes. It can affect nerves throughout the entire body, causing numbness and pain in the arms, legs, digestive tract, heart, and other body systems.  Keep your blood glucose level and your blood pressure in your target range, as directed by your health care provider. This can help prevent neuropathy from getting worse.  Check your skin and feet every day for cuts, bruises, redness, blisters, or sores.  Do not use any products that contain nicotine or tobacco, such as cigarettes and e-cigarettes. If you need help quitting, ask your health care provider. This information  is not intended to replace advice given to you by your health care provider. Make sure you discuss any questions you have with your health care provider. Document Revised: 10/11/2017 Document Reviewed: 10/03/2016 Elsevier Patient Education  2020 [employer].
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
## 2020-02-01 LAB — BASIC METABOLIC PANEL WITH GFR
BUN/Creatinine Ratio: 14 (ref 10–24)
BUN: 15 mg/dL (ref 8–27)
CO2: 26 mmol/L (ref 20–29)
Calcium: 9.9 mg/dL (ref 8.6–10.2)
Chloride: 105 mmol/L (ref 96–106)
Creatinine, Ser: 1.09 mg/dL (ref 0.76–1.27)
GFR calc Af Amer: 84 mL/min/1.73
GFR calc non Af Amer: 73 mL/min/1.73
Glucose: 247 mg/dL — ABNORMAL HIGH (ref 65–99)
Potassium: 5.1 mmol/L (ref 3.5–5.2)
Sodium: 144 mmol/L (ref 134–144)

## 2020-02-01 LAB — VITAMIN B12: Vitamin B-12: 813 pg/mL (ref 232–1245)

## 2020-02-01 LAB — C-PEPTIDE: C-Peptide: 1.8 ng/mL (ref 1.1–4.4)

## 2020-02-07 ENCOUNTER — Ambulatory Visit (INDEPENDENT_AMBULATORY_CARE_PROVIDER_SITE_OTHER): Payer: Medicaid Other

## 2020-02-07 ENCOUNTER — Other Ambulatory Visit: Payer: Self-pay

## 2020-02-07 ENCOUNTER — Ambulatory Visit (INDEPENDENT_AMBULATORY_CARE_PROVIDER_SITE_OTHER): Payer: Medicaid Other | Admitting: Family Medicine

## 2020-02-07 DIAGNOSIS — M542 Cervicalgia: Secondary | ICD-10-CM | POA: Diagnosis not present

## 2020-02-07 DIAGNOSIS — E1142 Type 2 diabetes mellitus with diabetic polyneuropathy: Secondary | ICD-10-CM | POA: Diagnosis not present

## 2020-02-07 MED ORDER — GABAPENTIN 300 MG PO CAPS: 300.0000 mg | ORAL_CAPSULE | Freq: Three times a day (TID) | ORAL | 3 refills | Status: DC

## 2020-02-07 NOTE — Progress Notes (Signed)
   Office Visit Note   [name]           Date of Birth: [birth year]           MRN: [id number] Visit Date: 02/07/2020 Requested by: Cain Saupe, MD [address] [place],  [place] [address] PCP: Cain Saupe, MD  Subjective: Chief Complaint  Patient presents with  . Neck - Pain    HPI: He is here for follow-up chronic neck and upper back pain.  He did physical therapy but did not notice much benefit.  He is taking gabapentin 300 mg at bedtime for peripheral neuropathy in his feet.  He has not noticed whether it helps his neck problems.  He is tingling in his fifth fingers.  Diabetes has been very poorly controlled.               ROS:   All other systems were reviewed and are negative.  Objective: Vital Signs: There were no vitals taken for this visit.  Physical Exam:  General:  Alert and oriented, in no acute distress. Pulm:  Breathing unlabored. Psy:  Normal mood, congruent affect.  Neck: Tender to palpation to the right of C7.  Spurling's test is equivocal.  Upper extremity strength and reflexes are still normal.  Imaging: XR Thoracic Spine 2 View  Result Date: 02/07/2020 X-rays thoracic spine reveal mild upper thoracic degenerative disc disease.  XR Cervical Spine 2 or 3 views  Result Date: 02/07/2020 X-ray cervical spine: He has severe spondylosis at multiple levels.   Assessment & Plan: 1.  Chronic neck pain with bilateral fifth finger tingling, and severe cervical spondylosis. -We will increase his gabapentin to 300 mg 3 times daily.  Gave him a note regarding work, I do not think he is capable of gainful employment at this point. -He will follow-up as needed.     Procedures: No procedures performed  No notes on file     PMFS History: There are no problems to display for this patient.  Past Medical History:  Diagnosis Date  . Depression   . Diabetes mellitus without complication (HCC)     Family History  Problem Relation Age of  Onset  . Hypertension Mother   . Diabetes Mother   . Vision loss Mother   . Diabetes Father     Past Surgical History:  Procedure Laterality Date  . ARTERY REPAIR Left    left side of neck cut by a piece of glass at age 23   Social History   Occupational History  . Not on file  Tobacco Use  . Smoking status: Never Smoker  . Smokeless tobacco: Never Used  Substance and Sexual Activity  . Alcohol use: Not Currently  . Drug use: Not Currently  . Sexual activity: Not Currently

## 2020-02-07 NOTE — Progress Notes (Signed)
PT hasnt helped

## 2020-02-10 ENCOUNTER — Encounter: Payer: Self-pay | Admitting: Family Medicine

## 2020-02-25 ENCOUNTER — Encounter: Payer: Medicaid Other | Attending: Internal Medicine | Admitting: Dietician

## 2020-02-25 ENCOUNTER — Other Ambulatory Visit: Payer: Self-pay

## 2020-02-25 ENCOUNTER — Encounter: Payer: Self-pay | Admitting: Dietician

## 2020-02-25 DIAGNOSIS — E1165 Type 2 diabetes mellitus with hyperglycemia: Secondary | ICD-10-CM | POA: Insufficient documentation

## 2020-02-25 DIAGNOSIS — E118 Type 2 diabetes mellitus with unspecified complications: Secondary | ICD-10-CM | POA: Diagnosis not present

## 2020-02-25 DIAGNOSIS — IMO0002 Reserved for concepts with insufficient information to code with codable children: Secondary | ICD-10-CM

## 2020-02-25 NOTE — Progress Notes (Signed)
Diabetes Self-Management Education  Visit Type: First/Initial  Appt. Start Time: 1430 Appt. End Time: 1517  02/25/2020  Mr. Keith Valdez, identified by name and date of birth, is a 59 y.o. male with a diagnosis of Diabetes: Type 2.   ASSESSMENT Patient is here today alone.  He would like to learn more about nutrition for diabetes.  He would like to gain weight.    History includes type 2 diabetes (2009), ED, neuropathy, depression A1C >15.5% 01/03/20 (18% per patient).   Medications include Januvia, glipizide.  CBG's improving on these medications.  He does not want to start insulin.  C-peptide was 1.8, eGFR 84, potassium 5.1, vitamin B-12 813 01/31/2020  Weight hx: 235 lbs 2009 Diagnosed with diabetes in 2009 and went on a binge and blood sugar was out of control.  Started on Metformin which gave him increased diarrhea and he lost weight. 190-200 lbs 2014 170 lbs 01/2020 174 lbs 02/25/2020 Goal per patient 200 lbs  Patient moved her 15 months ago from [place], [place].  He works as a Glass blower/designer.  He has problems with his neck and work causes this.  He works to pay child support.  He lives alone.  When he moved, he was off his medication for about 6 months.  States that he does not have time for exercise but does walk. Height 6' (1.829 m), weight 174 lb (78.9 kg). Body mass index is 23.6 kg/m.   Diabetes Self-Management Education - 02/25/20 1444      Visit Information   Visit Type First/Initial      Initial Visit   Diabetes Type Type 2    Are you currently following a meal plan? No    Are you taking your medications as prescribed? Yes    Date Diagnosed 2009      Health Coping   How would you rate your overall health? Fair      Psychosocial Assessment   Patient Belief/Attitude about Diabetes Motivated to manage diabetes    Self-care barriers None    Self-management support Doctor's office    Other persons present Patient    Patient Concerns Nutrition/Meal planning     Special Needs None    Preferred Learning Style No preference indicated    Learning Readiness Ready    How often do you need to have someone help you when you read instructions, pamphlets, or other written materials from your doctor or pharmacy? 1 - Never    What is the last grade level you completed in school? 12      Pre-Education Assessment   Patient understands the diabetes disease and treatment process. Needs Instruction    Patient understands incorporating nutritional management into lifestyle. Needs Instruction    Patient undertands incorporating physical activity into lifestyle. Needs Instruction    Patient understands using medications safely. Needs Instruction    Patient understands monitoring blood glucose, interpreting and using results Needs Instruction    Patient understands prevention, detection, and treatment of acute complications. Needs Instruction    Patient understands prevention, detection, and treatment of chronic complications. Needs Instruction    Patient understands how to develop strategies to address psychosocial issues. Needs Instruction    Patient understands how to develop strategies to promote health/change behavior. Needs Instruction      Complications   Last HgB A1C per patient/outside source 15.5 %   01/03/2020   How often do you check your blood sugar? 1-2 times/day    Fasting Blood glucose range (mg/dL)  70-129    Postprandial Blood glucose range (mg/dL) 130-179    Number of hypoglycemic episodes per month 0    Number of hyperglycemic episodes per week 3    Can you tell when your blood sugar is high? Yes    What do you do if your blood sugar is high? drinks water    Have you had a dilated eye exam in the past 12 months? No    Have you had a dental exam in the past 12 months? No    Are you checking your feet? Yes    How many days per week are you checking your feet? 3      Dietary Intake   Breakfast skips OR eggs OR grits, [place] bacon OR oatmeal OR  bagel with cream cheese AND fruit   8-9   Snack (morning) none    Lunch SKIPS    Snack (afternoon) potato chips or smoothie (banana, yogurt, protein powder)    Dinner chicken or fish or [place], vegetables, brown rice or corn or peas or beans    Snack (evening) none    Beverage(s) cranberry juice, water, herbal tea with honey (1 T per quart), 1 40 oz lite beer daily      Exercise   Exercise Type ADL's    How many days per week to you exercise? 0    How many minutes per day do you exercise? 0    Total minutes per week of exercise 0      Patient Education   Previous Diabetes Education Yes (please comment)   by MD's   Disease state  Definition of diabetes, type 1 and 2, and the diagnosis of diabetes;Explored patient's options for treatment of their diabetes    Nutrition management  Role of diet in the treatment of diabetes and the relationship between the three main macronutrients and blood glucose level;Food label reading, portion sizes and measuring food.;Carbohydrate counting;Meal options for control of blood glucose level and chronic complications.    Acute complications Taught treatment of hypoglycemia - the 15 rule.;Discussed and identified patients' treatment of hyperglycemia.    Chronic complications Retinopathy and reason for yearly dilated eye exams;Relationship between chronic complications and blood glucose control    Psychosocial adjustment Worked with patient to identify barriers to care and solutions;Identified and addressed patients feelings and concerns about diabetes      Individualized Goals (developed by patient)   Nutrition General guidelines for healthy choices and portions discussed;Follow meal plan discussed    Physical Activity Exercise 5-7 days per week;30 minutes per day    Medications take my medication as prescribed    Monitoring  test my blood glucose as discussed    Reducing Risk examine blood glucose patterns;increase portions of healthy fats;do foot checks  daily    Health Coping discuss diabetes with (comment)   MD, RD, CDCES     Post-Education Assessment   Patient understands the diabetes disease and treatment process. Demonstrates understanding / competency    Patient understands incorporating nutritional management into lifestyle. Needs Review    Patient undertands incorporating physical activity into lifestyle. Demonstrates understanding / competency    Patient understands using medications safely. Demonstrates understanding / competency    Patient understands monitoring blood glucose, interpreting and using results Demonstrates understanding / competency    Patient understands prevention, detection, and treatment of acute complications. Demonstrates understanding / competency    Patient understands prevention, detection, and treatment of chronic complications. Demonstrates understanding / competency  Patient understands how to develop strategies to address psychosocial issues. Needs Review    Patient understands how to develop strategies to promote health/change behavior. Needs Review      Outcomes   Expected Outcomes Demonstrated interest in learning. Expect positive outcomes    Future DMSE 2 months    Program Status Not Completed           Individualized Plan for Diabetes Self-Management Training:   Learning Objective:  Patient will have a greater understanding of diabetes self-management. Patient education plan is to attend individual and/or group sessions per assessed needs and concerns.   Plan:   Patient Instructions  Multi vitamin Eye exam  - Exercise:  Aim for at least 30 minutes of exercise such as walking most days. - Rethink your drink.    - Consider decreasing beer to 24 oz daily and be sure to only drink with a meal.  - Consider drinking more water and less juice.  Dilute your juice or choose diet.  To gain weight, you must control your blood sugar and increase how much you eat. Avoid skipping  meals Breakfast, lunch, dinner daily Aim for 60-75 grams of carbohydrate per meal (4-5 carb choices)  If your blood sugar increases please call your doctor.   Expected Outcomes:  Demonstrated interest in learning. Expect positive outcomes  Education material provided: ADA - How to Thrive: A Guide for Your Journey with Diabetes and Meal plan card  If problems or questions, patient to contact team via:  Phone  Future DSME appointment: 2 months

## 2020-02-25 NOTE — Patient Instructions (Addendum)
Multi vitamin Eye exam  - Exercise:  Aim for at least 30 minutes of exercise such as walking most days. - Rethink your drink.    - Consider decreasing beer to 24 oz daily and be sure to only drink with a meal.  - Consider drinking more water and less juice.  Dilute your juice or choose diet.  To gain weight, you must control your blood sugar and increase how much you eat. Avoid skipping meals Breakfast, lunch, dinner daily Aim for 60-75 grams of carbohydrate per meal (4-5 carb choices)  If your blood sugar increases please call your doctor.

## 2020-03-27 ENCOUNTER — Other Ambulatory Visit: Payer: Self-pay | Admitting: Family Medicine

## 2020-03-27 DIAGNOSIS — E1142 Type 2 diabetes mellitus with diabetic polyneuropathy: Secondary | ICD-10-CM

## 2020-03-27 NOTE — Telephone Encounter (Signed)
Pt needs refills for gabapentin (NEURONTIN) 300 MG capsule  Sent to the pharmacy where he currently lives./ he should have refills at another location but needs them sent to the new location / Pt would like them sent to  [id number] - [place], [place] - [address] Phone:  [phone]  Fax:  [phone]

## 2020-03-27 NOTE — Telephone Encounter (Signed)
Requested medication (s) are due for refill today: Yes  Requested medication (s) are on the active medication list: Yes  Last refill:  02/07/20  Future visit scheduled: Yes  Notes to clinic:  Last refilled by Dr. Prince Rome.    Requested Prescriptions  Pending Prescriptions Disp Refills   gabapentin (NEURONTIN) 300 MG capsule 90 capsule 3    Sig: Take 1 capsule (300 mg total) by mouth 3 (three) times daily.      Neurology: Anticonvulsants - gabapentin Passed - 03/27/2020 11:45 AM      Passed - Valid encounter within last 12 months    Recent Outpatient Visits           1 month ago Uncontrolled type 2 diabetes mellitus with hyperglycemia, without long-term current use of insulin (HCC)   [facility] [place], [place], MD   2 months ago Encounter to establish care   [facility] [place], [place], NP   8 months ago Chronic mid back pain   [facility] [place], [place], MD       Future Appointments             In 2 weeks [place], Marzella Schlein, PA-C [facility]   In 7 months Shamle[place], Konrad Dolores, MD [facility], [place]

## 2020-03-31 MED ORDER — GABAPENTIN 300 MG PO CAPS: 300.0000 mg | ORAL_CAPSULE | Freq: Three times a day (TID) | ORAL | 3 refills | Status: AC

## 2020-04-09 DIAGNOSIS — N5201 Erectile dysfunction due to arterial insufficiency: Secondary | ICD-10-CM | POA: Diagnosis not present

## 2020-04-10 ENCOUNTER — Other Ambulatory Visit: Payer: Self-pay | Admitting: Family

## 2020-04-10 DIAGNOSIS — E1142 Type 2 diabetes mellitus with diabetic polyneuropathy: Secondary | ICD-10-CM

## 2020-04-14 ENCOUNTER — Ambulatory Visit: Payer: Medicaid Other | Admitting: Dietician

## 2020-04-16 ENCOUNTER — Other Ambulatory Visit: Payer: Self-pay

## 2020-04-16 ENCOUNTER — Ambulatory Visit: Payer: Medicaid Other | Attending: Physician Assistant | Admitting: Physician Assistant

## 2020-04-16 ENCOUNTER — Encounter: Payer: Self-pay | Admitting: Physician Assistant

## 2020-04-16 VITALS — BP 150/82 | HR 66 | Resp 16 | Wt 177.4 lb

## 2020-04-16 DIAGNOSIS — G8929 Other chronic pain: Secondary | ICD-10-CM | POA: Diagnosis not present

## 2020-04-16 DIAGNOSIS — E1165 Type 2 diabetes mellitus with hyperglycemia: Secondary | ICD-10-CM

## 2020-04-16 DIAGNOSIS — M542 Cervicalgia: Secondary | ICD-10-CM

## 2020-04-16 DIAGNOSIS — Z13220 Encounter for screening for lipoid disorders: Secondary | ICD-10-CM | POA: Diagnosis not present

## 2020-04-16 DIAGNOSIS — M549 Dorsalgia, unspecified: Secondary | ICD-10-CM

## 2020-04-16 DIAGNOSIS — I10 Essential (primary) hypertension: Secondary | ICD-10-CM | POA: Diagnosis not present

## 2020-04-16 LAB — GLUCOSE, POCT (MANUAL RESULT ENTRY): POC Glucose: 300 mg/dl — AB (ref 70–99)

## 2020-04-16 LAB — POCT GLYCOSYLATED HEMOGLOBIN (HGB A1C): HbA1c, POC (controlled diabetic range): 8 % — AB (ref 0.0–7.0)

## 2020-04-16 MED ORDER — LISINOPRIL 20 MG PO TABS: 20.0000 mg | ORAL_TABLET | Freq: Every day | ORAL | 3 refills | Status: DC

## 2020-04-16 MED ORDER — JANUVIA 100 MG PO TABS: 100.0000 mg | ORAL_TABLET | Freq: Every day | ORAL | 3 refills | Status: DC

## 2020-04-16 MED ORDER — GLIPIZIDE 10 MG PO TABS: 10.0000 mg | ORAL_TABLET | Freq: Two times a day (BID) | ORAL | 3 refills | Status: DC

## 2020-04-16 NOTE — Progress Notes (Signed)
Keith Valdez, is a 59 y.o. male  CWC:[id number]  [id number]:[id number]  DOB - Nov 06, 1960  Subjective:  Chief Complaint and HPI: Keith Valdez is a 59 y.o. male here today for diabetes check.  He has been taking glipizide but has been out of [place] for about 2 weeks.  He has not had cholesterol checked since diagnosis of DM.  A1C in April was >15.5.  When on januvia too, blood sugars bt 100-200(he has his meter with him today).  Denies any s/sx/concerns today other than neuropathy but ortho just increased the dose on that a few weeks ago which has been helping.    He has been seeing ortho for his back and neck and shows me a note from the orthopedist that says he is unable to work.     ROS:   Constitutional:  No f/c, No night sweats, No unexplained weight loss. EENT:  No vision changes, No blurry vision, No hearing changes. No mouth, throat, or ear problems.  Respiratory: No cough, No SOB Cardiac: No CP, no palpitations GI:  No abd pain, No N/V/D. GU: No Urinary s/sx Musculoskeletal: back/neck pain and neuropathy Neuro: No headache, no dizziness, no motor weakness.  Skin: No rash Endocrine:  No polydipsia. No polyuria.  Psych: Denies SI/HI  No problems updated.  ALLERGIES: No Known Allergies  PAST MEDICAL HISTORY: Past Medical History:  Diagnosis Date  . Depression   . Diabetes mellitus without complication ([place])     MEDICATIONS AT HOME: Prior to Admission medications   Medication Sig Start Date End Date Taking? Authorizing Provider  Blood Glucose Monitoring Suppl (TRUE METRIX METER) w/Device KIT Use as directed 01/03/20   Camillia Herter, NP  gabapentin (NEURONTIN) 300 MG capsule Take 1 capsule (300 mg total) by mouth 3 (three) times daily. 03/31/20   Nicolette Bang, DO  glipiZIDE (GLUCOTROL) 10 MG tablet Take 1 tablet (10 mg total) by mouth 2 (two) times daily before a meal. 04/16/20   Kiah Keay, Dionne Bucy, PA-C  glucose blood (TRUE METRIX BLOOD GLUCOSE TEST) test strip  Use as instructed 01/03/20   Camillia Herter, NP  JANUVIA 100 MG tablet Take 1 tablet (100 mg total) by mouth daily. 04/16/20   Argentina Donovan, PA-C  lisinopril (ZESTRIL) 20 MG tablet Take 1 tablet (20 mg total) by mouth daily. 04/16/20   Argentina Donovan, PA-C  nabumetone (RELAFEN) 750 MG tablet Take 1 tablet (750 mg total) by mouth 2 (two) times daily as needed. Patient not taking: Reported on 01/03/2020 07/24/19   Hilts, Legrand Como, MD  TRUEplus Lancets 28G MISC Use as directed 01/03/20   Camillia Herter, NP     Objective:  EXAM:   Vitals:   04/16/20 0932  BP: (!) 150/82  Pulse: 66  Resp: 16  SpO2: 98%  Weight: 177 lb 6.4 oz (80.5 kg)    General appearance : A&OX3. NAD. Non-toxic-appearing HEENT: Atraumatic and Normocephalic.  PERRLA. EOM intact.  TM clear B. Mouth-MMM, post pharynx WNL w/o erythema, No PND. Neck: supple, no JVD. No cervical lymphadenopathy. No thyromegaly Chest/Lungs:  Breathing-non-labored, Good air entry bilaterally, breath sounds normal without rales, rhonchi, or wheezing  CVS: S1 S2 regular, no murmurs, gallops, rubs  Abdomen: Bowel sounds present, Non tender and not distended with no gaurding, rigidity or rebound. Extremities: Bilateral Lower Ext shows no edema, both legs are warm to touch with = pulse throughout Neurology:  CN II-XII grossly intact, Non focal.   Psych:  TP linear. J/I  WNL. Normal speech. Appropriate eye contact and affect.  Skin:  No Rash  Data Review Lab Results  Component Value Date   HGBA1C 8.0 (A) 04/16/2020   HGBA1C >15.5 (H) 01/03/2020     Assessment & Plan   1. Uncontrolled type 2 diabetes mellitus with hyperglycemia, without long-term current use of insulin ([place]) Much improved but out of [place].  Resume januvia.  Work on diet and continue to check sugars.  Goal is A1C<7.0 - Glucose (CBG) - HgB A1c - JANUVIA 100 MG tablet; Take 1 tablet (100 mg total) by mouth daily.  Dispense: 30 tablet; Refill: 3 - glipiZIDE (GLUCOTROL)  10 MG tablet; Take 1 tablet (10 mg total) by mouth 2 (two) times daily before a meal.  Dispense: 60 tablet; Refill: 3 - Comprehensive metabolic panel - Lipid panel  2. Hypertension, unspecified type Not on meds-start ACE - Comprehensive metabolic panel - lisinopril (ZESTRIL) 20 MG tablet; Take 1 tablet (20 mg total) by mouth daily.  Dispense: 90 tablet; Refill: 3  3. Screening cholesterol level - Lipid panel  4. Chronic neck and back pain Continue ortho follow-up   Patient have been counseled extensively about nutrition and exercise  Return in about 3 months (around 07/17/2020) for PCP;  chronic conditions.  The patient was given clear instructions to go to ER or return to medical center if symptoms don't improve, worsen or new problems develop. The patient verbalized understanding. The patient was told to call to get lab results if they haven't heard anything in the next week.     Freeman Caldron, PA-C [facility] [place] [facility] [place], [place]   04/16/2020, 9:50 AMPatient ID: Keith Valdez, male   DOB: 06-11-1961, 59 y.o.   MRN: [id number]

## 2020-04-17 LAB — COMPREHENSIVE METABOLIC PANEL
ALT: 18 IU/L (ref 0–44)
AST: 19 IU/L (ref 0–40)
Albumin/Globulin Ratio: 2.2 (ref 1.2–2.2)
Albumin: 4.6 g/dL (ref 3.8–4.8)
Alkaline Phosphatase: 185 IU/L — ABNORMAL HIGH (ref 48–121)
BUN/Creatinine Ratio: 17 (ref 10–24)
BUN: 17 mg/dL (ref 8–27)
Bilirubin Total: 0.2 mg/dL (ref 0.0–1.2)
CO2: 26 mmol/L (ref 20–29)
Calcium: 9.6 mg/dL (ref 8.6–10.2)
Chloride: 98 mmol/L (ref 96–106)
Creatinine, Ser: 1.03 mg/dL (ref 0.76–1.27)
GFR calc Af Amer: 90 mL/min/{1.73_m2} (ref >59)
GFR calc non Af Amer: 78 mL/min/{1.73_m2} (ref >59)
Globulin, Total: 2.1 g/dL (ref 1.5–4.5)
Glucose: 273 mg/dL — ABNORMAL HIGH (ref 65–99)
Potassium: 5.1 mmol/L (ref 3.5–5.2)
Sodium: 136 mmol/L (ref 134–144)
Total Protein: 6.7 g/dL (ref 6.0–8.5)

## 2020-04-17 LAB — LIPID PANEL
Chol/HDL Ratio: 2.5 ratio (ref 0.0–5.0)
Cholesterol, Total: 171 mg/dL (ref 100–199)
HDL: 68 mg/dL (ref >39)
LDL Chol Calc (NIH): 84 mg/dL (ref 0–99)
Triglycerides: 108 mg/dL (ref 0–149)
VLDL Cholesterol Cal: 19 mg/dL (ref 5–40)

## 2020-09-28 ENCOUNTER — Other Ambulatory Visit: Payer: Self-pay | Admitting: Physician Assistant

## 2020-09-28 DIAGNOSIS — E1165 Type 2 diabetes mellitus with hyperglycemia: Secondary | ICD-10-CM

## 2020-09-28 NOTE — Telephone Encounter (Signed)
Courtesy refill given, appointment needed, pt notified via MyChart.

## 2020-11-10 ENCOUNTER — Ambulatory Visit: Payer: Medicaid Other | Admitting: Internal Medicine

## 2020-11-11 ENCOUNTER — Other Ambulatory Visit: Payer: Self-pay | Admitting: Physician Assistant

## 2020-11-11 DIAGNOSIS — E1165 Type 2 diabetes mellitus with hyperglycemia: Secondary | ICD-10-CM

## 2020-11-14 ENCOUNTER — Other Ambulatory Visit: Payer: Self-pay | Admitting: Physician Assistant

## 2020-11-14 DIAGNOSIS — E1165 Type 2 diabetes mellitus with hyperglycemia: Secondary | ICD-10-CM

## 2020-11-16 NOTE — Progress Notes (Signed)
Name: Keith Valdez  MRN/ DOB: [id number], [birth year]   Age/ Sex: 60 y.o., male    PCP: Argentina Donovan, PA-C   Reason for Endocrinology Evaluation: Type 2 Diabetes Mellitus     Date of Initial Endocrinology Visit: 11/17/2020     PATIENT IDENTIFIER: Keith Valdez is a 60 y.o. male with a past medical history of T2DM and depression. The patient presented for initial endocrinology clinic visit on 11/17/2020 for consultative assistance with his diabetes management.    HPI: Keith Valdez was    Diagnosed with DM 2009 Prior Medications tried/Intolerance: Intolerant to Metformin  Currently checking blood sugars rarely  Hypoglycemia episodes : No               Hemoglobin A1c has ranged from 8.0% in 04/2020, peaking at >15.5% in 12/2019. Patient required assistance for hypoglycemia: no  Patient has required hospitalization within the last 1 year from hyper or hypoglycemia: no   In terms of diet, the patient eats 2  meals a day, snacks 1x a day, mostly fruits . Makes sweet tea with honey   Stopped Glipizide due to dark urine   Denies nausea  vomiting or diarrhea     HOME DIABETES REGIMEN: Glipizide 10 mg  Januvia - ran out Januvia 1.5 weeks    Statin: no ACE-I/ARB: yes Prior Diabetic Education: yes    METER DOWNLOAD SUMMARY: Did not bring   DIABETIC COMPLICATIONS: Microvascular complications:   Neuropathy   Denies: CKD, retinopathy   Last eye exam: Completed 2020  Macrovascular complications:    Denies: CAD, PVD, CVA   PAST HISTORY: Past Medical History:  Past Medical History:  Diagnosis Date  . Depression   . Diabetes mellitus without complication [facility])    Past Surgical History:  Past Surgical History:  Procedure Laterality Date  . ARTERY REPAIR Left    left side of neck cut by a piece of glass at age 1      Social History:  reports that he has never smoked. He has never used smokeless tobacco. He reports previous alcohol use. He reports previous  drug use. Family History:  Family History  Problem Relation Age of Onset  . Hypertension Mother   . Diabetes Mother   . Vision loss Mother   . Diabetes Father      HOME MEDICATIONS: Allergies as of 11/17/2020   No Known Allergies     Medication List       Accurate as of November 17, 2020 11:23 AM. If you have any questions, ask your nurse or doctor.        gabapentin 300 MG capsule Commonly known as: NEURONTIN Take 1 capsule (300 mg total) by mouth 3 (three) times daily.   glipiZIDE 10 MG tablet Commonly known as: GLUCOTROL Take 1 tablet (10 mg total) by mouth 2 (two) times daily before a meal.   Januvia 100 MG tablet Generic drug: sitaGLIPtin Take 1 tablet (100 mg total) by mouth daily. APPOINTMENT NEEDED FOR ADDITIONAL REFILLS   lisinopril 20 MG tablet Commonly known as: ZESTRIL Take 1 tablet (20 mg total) by mouth daily.   nabumetone 750 MG tablet Commonly known as: RELAFEN Take 1 tablet (750 mg total) by mouth 2 (two) times daily as needed.   True Metrix Blood Glucose Test test strip Generic drug: glucose blood Use as instructed   True Metrix Meter w/Device Kit Use as directed   TRUEplus Lancets 28G Misc Use as directed  ALLERGIES: No Known Allergies   REVIEW OF SYSTEMS: A comprehensive ROS was conducted with the patient and is negative except as per HPI   OBJECTIVE:   VITAL SIGNS: BP 140/82   Pulse (!) 102   Ht _0  (1.727 m)   Wt 170 lb 2 oz (77.2 kg)   SpO2 98%   BMI 25.87 kg/m    PHYSICAL EXAM:  General: Pt appears well and is in NAD  Neck: General: Supple without adenopathy or carotid bruits. Thyroid: Thyroid size normal.  No goiter or nodules appreciated.   Lungs: Clear with good BS bilat with no rales, rhonchi, or wheezes  Heart: RRR  Abdomen: Normoactive bowel sounds, soft, nontender, without masses or organomegaly palpable  Extremities:  Lower extremities - No pretibial edema. No lesions.  Neuro: MS is good with  appropriate affect, pt is alert and Ox3    DM foot exam: 11/17/2020  The skin of the feet is  without sores or ulcerations but has thickened discolored toe nails The pedal pulses are 2+ on right and 2+ on left. The sensation is absent  to a screening 5.07, 10 gram monofilament bilaterally   DATA REVIEWED:  Lab Results  Component Value Date   HGBA1C 12.8 (A) 11/17/2020   HGBA1C 8.0 (A) 04/16/2020   HGBA1C >15.5 (H) 01/03/2020   Lab Results  Component Value Date   LDLCALC 84 04/16/2020   CREATININE 1.03 04/16/2020   Lab Results  Component Value Date   MICRALBCREAT <7 01/03/2020    Lab Results  Component Value Date   CHOL 171 04/16/2020   HDL 68 04/16/2020   LDLCALC 84 04/16/2020   TRIG 108 04/16/2020   CHOLHDL 2.5 04/16/2020        ASSESSMENT / PLAN / RECOMMENDATIONS:   1) Type 2 Diabetes Mellitus, Poorly controlled, With Neuropathic  complications - Most recent A1c of 12.8 %. Goal A1c < 7.0 %.    - I have discussed with the patient the pathophysiology of diabetes. We went over the natural progression of the disease. We talked about both insulin resistance and insulin deficiency. We stressed the importance of lifestyle changes including diet and exercise. I explained the complications associated with diabetes including retinopathy, nephropathy, neuropathy as well as increased risk of cardiovascular disease. We went over the benefit seen with glycemic control.   - I explained to the patient that diabetic patients are at higher than normal risk for amputations.  - Pt with his own ideas about diabetes. He  Is under the impression that his diabetes " is under control". We discussed his high Bg readings today and his A1c and what it means. We also discussed the the goals for his diabetes control  - He has stopped Glipizide because it made his urine dark and he got worried about his renal function, again reminded him , diabetes could cause renal damage  - I have encouraged him to  check glucose at home regularly, if he was actually checking he would have noted hyperglycemia  - I have offered him Insulin with an A1c > 10 % but he declines  - I have offered him a GLP-1 agonist but he declines , discussed benefits in comparison to Januvia but he again declines  - He agreed to restart Glipizide and asking for a refill on Januvia    MEDICATIONS: - Restart Glipizide 10 mg, 1 tablet Before Breakfast and 1 tablet before Supper  - Continue Januvia 100 mg daily    EDUCATION /  INSTRUCTIONS:  BG monitoring instructions: Patient is instructed to check his blood sugars 1 times a day, fasting .  Call [facility] if: BG persistently < 70  . I reviewed the Rule of 15 for the treatment of hypoglycemia in detail with the patient. Literature supplied.   2) Diabetic complications:   Eye: Does not have known diabetic retinopathy.   Neuro/ Feet: Does  have known diabetic peripheral neuropathy.  Renal: Patient does not have known baseline CKD. I have encouraged him to restart Lisinopril    3) Peripheral neuropathy:  - I have encouraged him to work on controlling diabetes as this would slow down any further damage - CBD oil is helping, he is trying to get Marijuana card ? - I have advised him to try Capsaicin cream    F/U in 3 months    I spent 45 minutes preparing to see the patient by review of recent labs, imaging and procedures, obtaining and reviewing separately obtained history, communicating with the patient, ordering medications, tests or procedures, and documenting clinical information in the EHR including the differential Dx, treatment, and any further evaluation and other management    Signed electronically by: Mack Guise, MD  [facility]  [facility] [address] [place],  [address] Phone: [phone] FAX: [phone]   CC: Argentina Donovan, PA-C [place]  [address] Phone: [phone]  Fax: [phone]    Return to Endocrinology clinic as below: Future Appointments  Date Time Provider [place]  11/17/2020 11:30 AM Lorretta Kerce, Melanie Crazier, MD [id number]

## 2020-11-17 ENCOUNTER — Ambulatory Visit: Payer: Medicaid Other | Admitting: Internal Medicine

## 2020-11-17 ENCOUNTER — Encounter: Payer: Self-pay | Admitting: Internal Medicine

## 2020-11-17 ENCOUNTER — Other Ambulatory Visit: Payer: Self-pay

## 2020-11-17 VITALS — BP 140/82 | HR 102 | Ht 68.0 in | Wt 170.1 lb

## 2020-11-17 DIAGNOSIS — E1165 Type 2 diabetes mellitus with hyperglycemia: Secondary | ICD-10-CM | POA: Insufficient documentation

## 2020-11-17 DIAGNOSIS — E1142 Type 2 diabetes mellitus with diabetic polyneuropathy: Secondary | ICD-10-CM

## 2020-11-17 DIAGNOSIS — I10 Essential (primary) hypertension: Secondary | ICD-10-CM | POA: Diagnosis not present

## 2020-11-17 LAB — POCT GLYCOSYLATED HEMOGLOBIN (HGB A1C): Hemoglobin A1C: 12.8 % — AB (ref 4.0–5.6)

## 2020-11-17 LAB — POCT GLUCOSE (DEVICE FOR HOME USE): POC Glucose: 578 mg/dl — AB (ref 70–99)

## 2020-11-17 MED ORDER — LISINOPRIL 20 MG PO TABS: 20.0000 mg | ORAL_TABLET | Freq: Every day | ORAL | 3 refills | Status: DC

## 2020-11-17 MED ORDER — LISINOPRIL 20 MG PO TABS: 20.0000 mg | ORAL_TABLET | Freq: Every day | ORAL | 1 refills | Status: AC

## 2020-11-17 MED ORDER — GLIPIZIDE 10 MG PO TABS: 10.0000 mg | ORAL_TABLET | Freq: Two times a day (BID) | ORAL | 1 refills | Status: AC

## 2020-11-17 MED ORDER — JANUVIA 100 MG PO TABS: 100.0000 mg | ORAL_TABLET | Freq: Every day | ORAL | 6 refills | Status: AC

## 2020-11-17 NOTE — Patient Instructions (Signed)
-   Restart Glipizide 10 mg, 1 tablet Before Breakfast and 1 tablet before Supper  - Continue Januvia 100 mg daily     - Try Capsaicin cream for feet     HOW TO TREAT LOW BLOOD SUGARS (Blood sugar LESS THAN 70 MG/DL)  Please follow the RULE OF 15 for the treatment of hypoglycemia treatment (when your (blood sugars are less than 70 mg/dL)    STEP 1: Take 15 grams of carbohydrates when your blood sugar is low, which includes:   3-4 GLUCOSE TABS  OR  3-4 OZ OF JUICE OR REGULAR SODA OR  ONE TUBE OF GLUCOSE GEL     STEP 2: RECHECK blood sugar in 15 MINUTES STEP 3: If your blood sugar is still low at the 15 minute recheck --> then, go back to STEP 1 and treat AGAIN with another 15 grams of carbohydrates.

## 2020-11-19 ENCOUNTER — Emergency Department (HOSPITAL_BASED_OUTPATIENT_CLINIC_OR_DEPARTMENT_OTHER): Payer: Medicaid Other

## 2020-11-19 ENCOUNTER — Other Ambulatory Visit: Payer: Self-pay

## 2020-11-19 ENCOUNTER — Encounter (HOSPITAL_BASED_OUTPATIENT_CLINIC_OR_DEPARTMENT_OTHER): Payer: Self-pay

## 2020-11-19 ENCOUNTER — Emergency Department (HOSPITAL_BASED_OUTPATIENT_CLINIC_OR_DEPARTMENT_OTHER)
Admission: EM | Admit: 2020-11-19 | Discharge: 2020-11-19 | Disposition: A | Discharge status: Home / Self Care | Payer: Medicaid Other | Attending: Emergency Medicine | Admitting: Emergency Medicine

## 2020-11-19 DIAGNOSIS — E1143 Type 2 diabetes mellitus with diabetic autonomic (poly)neuropathy: Secondary | ICD-10-CM | POA: Insufficient documentation

## 2020-11-19 DIAGNOSIS — K760 Fatty (change of) liver, not elsewhere classified: Secondary | ICD-10-CM | POA: Diagnosis not present

## 2020-11-19 DIAGNOSIS — I10 Essential (primary) hypertension: Secondary | ICD-10-CM | POA: Diagnosis not present

## 2020-11-19 DIAGNOSIS — R112 Nausea with vomiting, unspecified: Secondary | ICD-10-CM | POA: Diagnosis not present

## 2020-11-19 DIAGNOSIS — R197 Diarrhea, unspecified: Secondary | ICD-10-CM | POA: Insufficient documentation

## 2020-11-19 DIAGNOSIS — E1165 Type 2 diabetes mellitus with hyperglycemia: Secondary | ICD-10-CM | POA: Diagnosis not present

## 2020-11-19 DIAGNOSIS — R634 Abnormal weight loss: Secondary | ICD-10-CM | POA: Diagnosis not present

## 2020-11-19 DIAGNOSIS — Z79899 Other long term (current) drug therapy: Secondary | ICD-10-CM | POA: Diagnosis not present

## 2020-11-19 DIAGNOSIS — R1084 Generalized abdominal pain: Secondary | ICD-10-CM | POA: Diagnosis not present

## 2020-11-19 DIAGNOSIS — Z7984 Long term (current) use of oral hypoglycemic drugs: Secondary | ICD-10-CM | POA: Insufficient documentation

## 2020-11-19 DIAGNOSIS — R109 Unspecified abdominal pain: Secondary | ICD-10-CM | POA: Diagnosis not present

## 2020-11-19 LAB — COMPREHENSIVE METABOLIC PANEL
ALT: 29 U/L (ref 0–44)
AST: 24 U/L (ref 15–41)
Albumin: 4.5 g/dL (ref 3.5–5.0)
Alkaline Phosphatase: 100 U/L (ref 38–126)
Anion gap: 13 (ref 5–15)
BUN: 17 mg/dL (ref 8–23)
CO2: 25 mmol/L (ref 22–32)
Calcium: 9.7 mg/dL (ref 8.9–10.3)
Chloride: 96 mmol/L — ABNORMAL LOW (ref 98–111)
Creatinine, Ser: 0.99 mg/dL (ref 0.61–1.24)
GFR, Estimated: >60 mL/min (ref >60)
Glucose, Bld: 391 mg/dL — ABNORMAL HIGH (ref 70–99)
Potassium: 5.2 mmol/L — ABNORMAL HIGH (ref 3.5–5.1)
Sodium: 134 mmol/L — ABNORMAL LOW (ref 135–145)
Total Bilirubin: 0.9 mg/dL (ref 0.3–1.2)
Total Protein: 7.2 g/dL (ref 6.5–8.1)

## 2020-11-19 LAB — CBC WITH DIFFERENTIAL/PLATELET
Abs Immature Granulocytes: 0.02 10*3/uL (ref 0.00–0.07)
Basophils Absolute: 0.1 10*3/uL (ref 0.0–0.1)
Basophils Relative: 1 %
Eosinophils Absolute: 0 10*3/uL (ref 0.0–0.5)
Eosinophils Relative: 1 %
HCT: 50.2 % (ref 39.0–52.0)
Hemoglobin: 17.2 g/dL — ABNORMAL HIGH (ref 13.0–17.0)
Immature Granulocytes: 0 %
Lymphocytes Relative: 15 %
Lymphs Abs: 1.1 10*3/uL (ref 0.7–4.0)
MCH: 30 pg (ref 26.0–34.0)
MCHC: 34.3 g/dL (ref 30.0–36.0)
MCV: 87.5 fL (ref 80.0–100.0)
Monocytes Absolute: 0.4 10*3/uL (ref 0.1–1.0)
Monocytes Relative: 6 %
Neutro Abs: 5.4 10*3/uL (ref 1.7–7.7)
Neutrophils Relative %: 77 %
Platelets: 308 10*3/uL (ref 150–400)
RBC: 5.74 MIL/uL (ref 4.22–5.81)
RDW: 11.9 % (ref 11.5–15.5)
WBC: 7 10*3/uL (ref 4.0–10.5)
nRBC: 0 % (ref 0.0–0.2)

## 2020-11-19 LAB — LIPASE, BLOOD: Lipase: 320 U/L — ABNORMAL HIGH (ref 11–51)

## 2020-11-19 LAB — CBG MONITORING, ED: Glucose-Capillary: 212 mg/dL — ABNORMAL HIGH (ref 70–99)

## 2020-11-19 MED ORDER — ONDANSETRON HCL 4 MG/2ML IJ SOLN
4.0000 mg | Freq: Once | INTRAMUSCULAR | Status: AC
  Administered 2020-11-19: 4 mg via INTRAVENOUS
  Filled 2020-11-19: qty 2

## 2020-11-19 MED ORDER — SODIUM CHLORIDE 0.9 % IV BOLUS
1000.0000 mL | Freq: Once | INTRAVENOUS | Status: AC
  Administered 2020-11-19: 1000 mL via INTRAVENOUS

## 2020-11-19 MED ORDER — INSULIN ASPART 100 UNIT/ML ~~LOC~~ SOLN
8.0000 [IU] | Freq: Once | SUBCUTANEOUS | Status: AC
  Administered 2020-11-19: 8 [IU] via SUBCUTANEOUS

## 2020-11-19 MED ORDER — IOHEXOL 300 MG/ML  SOLN
100.0000 mL | Freq: Once | INTRAMUSCULAR | Status: AC | PRN
  Administered 2020-11-19: 100 mL via INTRAVENOUS

## 2020-11-19 MED ORDER — ONDANSETRON HCL 4 MG PO TABS: 4.0000 mg | ORAL_TABLET | Freq: Four times a day (QID) | ORAL | 0 refills | Status: AC

## 2020-11-19 NOTE — Discharge Instructions (Addendum)
Continue taking your diabetes medications as prescribed. Be sure to stay hydrated. Monitor your blood sugars at home. Follow up with your PCP in the next 2-3 days.   Stop by the pharmacy to pick up your prescription to help with nausea (Zofran).   Take Care,   Dr. Alecia Lemming [facility]

## 2020-11-19 NOTE — ED Triage Notes (Addendum)
Pt c/o n/v/d since 10am-elevated BS-out of DM meds x ~1 month-pt ambulatory by EMS-NAD-steady gait-pt was given zofran 4mg  IV by EMS PTA-pt states he had abd cramping prior to med-denies at present-no vomiting in traige

## 2020-11-19 NOTE — ED Provider Notes (Signed)
[place] EMERGENCY DEPARTMENT Provider Note   CSN: [id number] Arrival date & time: 11/19/20  1231     History Chief Complaint  Patient presents with  . Vomiting    Keith Valdez is a 60 y.o. male with hx of uncontrolled DM and HTN.   HPI  Patient here for non-bloody vomiting and diarrhea that started this morning. He has been out of his DM medications for about a month. He went to the doctor the other day to get a refill. He reports his blood sugars have been >500 despite taking his medication. Endorses abdominal cramping and weight loss. Denies fever, cough, recent sick contacts, chest pain, shortness of breath, dysuria. EMS gave him Zofran with relief of vomiting, diarrhea and pain.   States he drinks a beer "every now and then" but not regularly. He smoked weed to help his pain. Missed work because of sx.     Past Medical History:  Diagnosis Date  . Depression   . Diabetes mellitus without complication [facility])     Patient Active Problem List   Diagnosis Date Noted  . Type 2 diabetes mellitus with diabetic polyneuropathy, without long-term current use of insulin ([place]) 11/17/2020  . Uncontrolled type 2 diabetes mellitus with hyperglycemia, without long-term current use of insulin ([place]) 11/17/2020  . Hypertension 11/17/2020    Past Surgical History:  Procedure Laterality Date  . ARTERY REPAIR Left    left side of neck cut by a piece of glass at age 24       Family History  Problem Relation Age of Onset  . Hypertension Mother   . Diabetes Mother   . Vision loss Mother   . Diabetes Father     Social History   Tobacco Use  . Smoking status: Never Smoker  . Smokeless tobacco: Never Used  Vaping Use  . Vaping Use: Never used  Substance Use Topics  . Alcohol use: Yes    Comment: occ  . Drug use: Yes    Types: Marijuana    Home Medications Prior to Admission medications   Medication Sig Start Date End Date Taking? Authorizing Provider   ondansetron (ZOFRAN) 4 MG tablet Take 1 tablet (4 mg total) by mouth every 6 (six) hours. 11/19/20  Yes Brimage, Vondra, DO  Blood Glucose Monitoring Suppl (TRUE METRIX METER) w/Device KIT Use as directed 01/03/20   Camillia Herter, NP  gabapentin (NEURONTIN) 300 MG capsule Take 1 capsule (300 mg total) by mouth 3 (three) times daily. Patient not taking: Reported on 11/17/2020 03/31/20   Nicolette Bang, DO  glipiZIDE (GLUCOTROL) 10 MG tablet Take 1 tablet (10 mg total) by mouth 2 (two) times daily before a meal. 11/17/20   Shamleffer, Melanie Crazier, MD  glucose blood (TRUE METRIX BLOOD GLUCOSE TEST) test strip Use as instructed 01/03/20   Camillia Herter, NP  JANUVIA 100 MG tablet Take 1 tablet (100 mg total) by mouth daily. 11/17/20   Shamleffer, Melanie Crazier, MD  lisinopril (ZESTRIL) 20 MG tablet Take 1 tablet (20 mg total) by mouth daily. 11/17/20   Shamleffer, Melanie Crazier, MD  nabumetone (RELAFEN) 750 MG tablet Take 1 tablet (750 mg total) by mouth 2 (two) times daily as needed. Patient not taking: Reported on 11/17/2020 07/24/19   Hilts, Legrand Como, MD  TRUEplus Lancets 28G MISC Use as directed 01/03/20   Camillia Herter, NP    Allergies    Patient has no known allergies.  Review of Systems   Review  of Systems  Constitutional: Positive for fatigue. Negative for fever.  HENT: Negative for congestion, rhinorrhea and sore throat.   Respiratory: Negative for cough and shortness of breath.   Cardiovascular: Negative for chest pain.  Gastrointestinal: Positive for abdominal pain, diarrhea, nausea and vomiting. Negative for blood in stool.  Musculoskeletal: Negative for back pain.  Skin: Negative for rash.  Neurological: Negative for weakness.  All other systems reviewed and are negative.   Physical Exam Updated Vital Signs BP (!) 147/89 (BP Location: Left Arm)   Pulse 84   Temp 97.9 F (36.6 C) (Oral)   Resp 20   Ht 6' 1" (1.854 m)   Wt 70.8 kg   SpO2 99%   BMI 20.59 kg/m    Physical Exam Vitals and nursing note reviewed.  Constitutional:      Appearance: Normal appearance.  HENT:     Head: Normocephalic and atraumatic.     Nose: Nose normal.     Mouth/Throat:     Mouth: Mucous membranes are dry.     Pharynx: Oropharynx is clear. No posterior oropharyngeal erythema.  Eyes:     General: No scleral icterus.       Right eye: No discharge.        Left eye: No discharge.  Cardiovascular:     Rate and Rhythm: Normal rate and regular rhythm.     Pulses: Normal pulses.     Heart sounds: Normal heart sounds.  Pulmonary:     Effort: Pulmonary effort is normal. No respiratory distress.     Breath sounds: Normal breath sounds. No wheezing, rhonchi or rales.  Abdominal:     General: Abdomen is flat. Bowel sounds are normal. There is no distension.     Palpations: Abdomen is soft. There is no mass.     Tenderness: There is no abdominal tenderness. There is no guarding or rebound.  Musculoskeletal:        General: Normal range of motion.     Right lower leg: No edema.     Left lower leg: No edema.  Skin:    General: Skin is warm and dry.     Capillary Refill: Capillary refill takes less than 2 seconds.  Neurological:     Mental Status: He is alert and oriented to person, place, and time.  Psychiatric:        Mood and Affect: Mood normal.        Behavior: Behavior normal.     ED Results / Procedures / Treatments   Labs (all labs ordered are listed, but only abnormal results are displayed) Labs Reviewed  CBC WITH DIFFERENTIAL/PLATELET - Abnormal; Notable for the following components:      Result Value   Hemoglobin 17.2 (*)    All other components within normal limits  COMPREHENSIVE METABOLIC PANEL - Abnormal; Notable for the following components:   Sodium 134 (*)    Potassium 5.2 (*)    Chloride 96 (*)    Glucose, Bld 391 (*)    All other components within normal limits  LIPASE, BLOOD - Abnormal; Notable for the following components:   Lipase  320 (*)    All other components within normal limits  CBG MONITORING, ED - Abnormal; Notable for the following components:   Glucose-Capillary 212 (*)    All other components within normal limits  URINALYSIS, ROUTINE W REFLEX MICROSCOPIC  CBG MONITORING, ED    EKG None  Radiology CT ABDOMEN PELVIS W CONTRAST  Result Date: 11/19/2020  CLINICAL DATA:  Abdominal distension. EXAM: CT ABDOMEN AND PELVIS WITH CONTRAST TECHNIQUE: Multidetector CT imaging of the abdomen and pelvis was performed using the standard protocol following bolus administration of intravenous contrast. CONTRAST:  163m OMNIPAQUE IOHEXOL 300 MG/ML  SOLN COMPARISON:  None. FINDINGS: Lower chest: No acute abnormality. Hepatobiliary: There is diffuse fatty infiltration of the liver parenchyma. No focal liver abnormality is seen. No gallstones, gallbladder wall thickening, or biliary dilatation. Pancreas: Unremarkable. No pancreatic ductal dilatation or surrounding inflammatory changes. Spleen: Normal in size without focal abnormality. Adrenals/Urinary Tract: Adrenal glands are unremarkable. Kidneys are normal, without renal calculi, focal lesion, or hydronephrosis. Bladder is unremarkable. Stomach/Bowel: Stomach is within normal limits. The appendix is poorly visualized. No evidence of bowel dilatation. Diverticula are seen throughout the large bowel. Vascular/Lymphatic: Mild aortic atherosclerosis. No enlarged abdominal or pelvic lymph nodes. Reproductive: There is mild to moderate severity prostate gland enlargement. Other: No abdominal wall hernia or abnormality. No abdominopelvic ascites. Musculoskeletal: Multilevel degenerative changes seen throughout the lumbar spine. IMPRESSION: 1. Hepatic steatosis. 2. Colonic diverticulosis. 3. Mild to moderate severity prostate gland enlargement. 4. Aortic atherosclerosis. Aortic Atherosclerosis (ICD10-I70.0). Electronically Signed   By: TVirgina NorfolkM.D.   On: 11/19/2020 17:16     Procedures Procedures    Medications Ordered in ED Medications  sodium chloride 0.9 % bolus 1,000 mL (0 mLs Intravenous Stopped 11/19/20 1654)  ondansetron (ZOFRAN) injection 4 mg (4 mg Intravenous Given 11/19/20 1550)  insulin aspart (novoLOG) injection 8 Units (8 Units Subcutaneous Given 11/19/20 1550)  iohexol (OMNIPAQUE) 300 MG/ML solution 100 mL (100 mLs Intravenous Contrast Given 11/19/20 1618)    ED Course  I have reviewed the triage vital signs and the nursing notes.  Pertinent labs & imaging results that were available during my care of the patient were reviewed by me and considered in my medical decision making (see chart for details).  4:55 PM  Reassess Pt denying sx s/p Zofran. Still getting IVF. Await results of CT.      MDM Rules/Calculators/A&P                          Pt is a 60yo male with T2DM and HTN who presents for nausea, vomiting, diarrhea. Pt has uncontrolled diabetes. Last a1c 12.8. VSS. Pt is hyperglycemic (392), hyperkalemic (K 5.2) with normal bicarb and anion gap. Does have evidence of hemoconcentration.  Will give IVF bolus, insulin. Possibly viral gastroenteritis. Lipase elevated at 320. Denies recent alcohol use or hx of pancreatitis. Will obtain CT ABD Pelvis to evaluate for possible pancreatitis. PO trial.   CT not concerning for acute pancreatitis but was notable for hepatic steatosis, diverticulosis and mild to moderate prostate enlargement. Pt updated with findings. He is asymptomatic and is hungry. Repeat CBG 212. Will discharge home. Pt agrees with plan.    Final Clinical Impression(s) / ED Diagnoses Final diagnoses:  Non-intractable vomiting with nausea, unspecified vomiting type  [place] to diabetes mellitus [place]    Rx / DC Orders ED Discharge Orders         Ordered    ondansetron (ZOFRAN) 4 MG tablet  Every 6 hours        11/19/20 1744           BLyndee Hensen DO 11/19/20 1751    YDrenda Freeze  MD 11/20/20 [phone]

## 2020-11-20 DIAGNOSIS — I10 Essential (primary) hypertension: Secondary | ICD-10-CM | POA: Diagnosis not present

## 2020-11-20 DIAGNOSIS — K859 Acute pancreatitis without necrosis or infection, unspecified: Secondary | ICD-10-CM | POA: Diagnosis not present

## 2020-11-20 DIAGNOSIS — R112 Nausea with vomiting, unspecified: Secondary | ICD-10-CM | POA: Diagnosis not present

## 2020-11-20 DIAGNOSIS — E1165 Type 2 diabetes mellitus with hyperglycemia: Secondary | ICD-10-CM | POA: Diagnosis not present

## 2020-11-20 DIAGNOSIS — R110 Nausea: Secondary | ICD-10-CM | POA: Diagnosis not present

## 2020-11-20 DIAGNOSIS — R1111 Vomiting without nausea: Secondary | ICD-10-CM | POA: Diagnosis not present

## 2020-11-20 DIAGNOSIS — R1084 Generalized abdominal pain: Secondary | ICD-10-CM | POA: Diagnosis not present

## 2020-11-21 DIAGNOSIS — E1122 Type 2 diabetes mellitus with diabetic chronic kidney disease: Secondary | ICD-10-CM | POA: Diagnosis not present

## 2020-11-21 DIAGNOSIS — Z7984 Long term (current) use of oral hypoglycemic drugs: Secondary | ICD-10-CM | POA: Diagnosis not present

## 2020-11-21 DIAGNOSIS — K7581 Nonalcoholic steatohepatitis (NASH): Secondary | ICD-10-CM | POA: Diagnosis not present

## 2020-11-21 DIAGNOSIS — I129 Hypertensive chronic kidney disease with stage 1 through stage 4 chronic kidney disease, or unspecified chronic kidney disease: Secondary | ICD-10-CM | POA: Diagnosis not present

## 2020-11-21 DIAGNOSIS — E876 Hypokalemia: Secondary | ICD-10-CM | POA: Diagnosis not present

## 2020-11-21 DIAGNOSIS — K853 Drug induced acute pancreatitis without necrosis or infection: Secondary | ICD-10-CM | POA: Diagnosis not present

## 2020-11-21 DIAGNOSIS — R112 Nausea with vomiting, unspecified: Secondary | ICD-10-CM | POA: Diagnosis not present

## 2020-11-21 DIAGNOSIS — T383X5A Adverse effect of insulin and oral hypoglycemic [antidiabetic] drugs, initial encounter: Secondary | ICD-10-CM | POA: Diagnosis not present

## 2020-11-21 DIAGNOSIS — Z20822 Contact with and (suspected) exposure to covid-19: Secondary | ICD-10-CM | POA: Diagnosis not present

## 2020-11-21 DIAGNOSIS — K859 Acute pancreatitis without necrosis or infection, unspecified: Secondary | ICD-10-CM | POA: Diagnosis not present

## 2020-11-21 DIAGNOSIS — K8500 Idiopathic acute pancreatitis without necrosis or infection: Secondary | ICD-10-CM | POA: Diagnosis not present

## 2020-11-21 DIAGNOSIS — N183 Chronic kidney disease, stage 3 unspecified: Secondary | ICD-10-CM | POA: Diagnosis not present

## 2020-11-21 DIAGNOSIS — E1165 Type 2 diabetes mellitus with hyperglycemia: Secondary | ICD-10-CM | POA: Diagnosis not present

## 2020-11-22 DIAGNOSIS — R112 Nausea with vomiting, unspecified: Secondary | ICD-10-CM | POA: Diagnosis not present

## 2020-11-22 DIAGNOSIS — E1165 Type 2 diabetes mellitus with hyperglycemia: Secondary | ICD-10-CM | POA: Diagnosis not present

## 2020-11-22 DIAGNOSIS — N183 Chronic kidney disease, stage 3 unspecified: Secondary | ICD-10-CM | POA: Diagnosis not present

## 2020-11-22 DIAGNOSIS — K853 Drug induced acute pancreatitis without necrosis or infection: Secondary | ICD-10-CM | POA: Diagnosis not present

## 2020-11-22 DIAGNOSIS — E1122 Type 2 diabetes mellitus with diabetic chronic kidney disease: Secondary | ICD-10-CM | POA: Diagnosis not present

## 2020-11-22 DIAGNOSIS — T383X5A Adverse effect of insulin and oral hypoglycemic [antidiabetic] drugs, initial encounter: Secondary | ICD-10-CM | POA: Diagnosis not present

## 2020-11-22 DIAGNOSIS — I129 Hypertensive chronic kidney disease with stage 1 through stage 4 chronic kidney disease, or unspecified chronic kidney disease: Secondary | ICD-10-CM | POA: Diagnosis not present

## 2020-11-23 DIAGNOSIS — Z9114 Patient's other noncompliance with medication regimen: Secondary | ICD-10-CM | POA: Diagnosis not present

## 2020-11-23 DIAGNOSIS — E1165 Type 2 diabetes mellitus with hyperglycemia: Secondary | ICD-10-CM | POA: Diagnosis not present

## 2020-11-23 DIAGNOSIS — E1122 Type 2 diabetes mellitus with diabetic chronic kidney disease: Secondary | ICD-10-CM | POA: Diagnosis not present

## 2020-11-23 DIAGNOSIS — K859 Acute pancreatitis without necrosis or infection, unspecified: Secondary | ICD-10-CM | POA: Diagnosis not present

## 2020-11-23 DIAGNOSIS — N183 Chronic kidney disease, stage 3 unspecified: Secondary | ICD-10-CM | POA: Diagnosis not present

## 2020-11-23 DIAGNOSIS — I129 Hypertensive chronic kidney disease with stage 1 through stage 4 chronic kidney disease, or unspecified chronic kidney disease: Secondary | ICD-10-CM | POA: Diagnosis not present

## 2020-11-24 ENCOUNTER — Telehealth: Payer: Self-pay | Admitting: *Deleted

## 2020-11-24 NOTE — Telephone Encounter (Signed)
Transition Care Management Unsuccessful Follow-up Telephone Call  Date of discharge and from where: 11-23-20 [facility]  Attempts:  1st  Reason for unsuccessful TCM follow-up call:   No answer/LVM

## 2020-11-25 ENCOUNTER — Telehealth: Payer: Self-pay | Admitting: *Deleted

## 2020-11-25 NOTE — Telephone Encounter (Signed)
Transition Care Management Follow-up Telephone Call  Date of discharge and from where: 11/23/2020 - [facility]  How have you been since you were released from the hospital? "Feeling better"  Any questions or concerns? No  Items Reviewed:  Did the pt receive and understand the discharge instructions provided? Yes   Medications obtained and verified? Yes   Other? No   Any new allergies since your discharge? No   Dietary orders reviewed? Yes  Do you have support at home? Yes   Home Care and Equipment/Supplies: Were home health services ordered? not applicable If so, what is the name of the agency? N/A  Has the agency set up a time to come to the patient's home? not applicable Were any new equipment or medical supplies ordered?  No What is the name of the medical supply agency? N/A Were you able to get the supplies/equipment? not applicable Do you have any questions related to the use of the equipment or supplies? No  Functional Questionnaire: (I = Independent and D = Dependent) ADLs: I  Bathing/Dressing- I  Meal Prep- I  Eating- I  Maintaining continence- I  Transferring/Ambulation- I  Managing Meds- I  Follow up appointments reviewed:   PCP Hospital f/u appt confirmed? Yes  Scheduled to see PCP on 02/23/2021 @ 1320 for a routine appointment.  Specialist Hospital f/u appt confirmed? No    Are transportation arrangements needed? No   If their condition worsens, is the pt aware to call PCP or go to the Emergency Dept.? Yes  Was the patient provided with contact information for the PCP's office or ED? Yes  Was to pt encouraged to call back with questions or concerns? Yes

## 2021-02-23 ENCOUNTER — Ambulatory Visit: Payer: Medicaid Other | Admitting: Internal Medicine

## 2021-02-23 NOTE — Progress Notes (Deleted)
Name: Keith Valdez  Age/ Sex: 60 y.o., male   MRN/ DOB: [id number], 1961/09/02     PCP: Cloyd Stagers, MD   Reason for Endocrinology Evaluation: Type 2 Diabetes Mellitus  Initial Endocrine Consultative Visit: 11/17/2020    PATIENT IDENTIFIER: Keith Valdez is a 60 y.o. male with a past medical history of T2DM, Depression and Hx of pancreatitis (11/2020) . The patient has followed with Endocrinology clinic since 11/17/2020 for consultative assistance with management of his diabetes.  DIABETIC HISTORY:  Keith Valdez was diagnosed with DM in 2009, he is intolerant to Metformin . His hemoglobin A1c has ranged from 8.0% in 04/2020, peaking at >15.5% in 12/2019.   On his intial visit to our clinic he has an A1c 12.8 %   SUBJECTIVE:   During the last visit (11/17/2020): A1c was 12.8%   Today (02/23/2021): Keith Valdez is here for a follow up on diabetes management.  He checks his blood sugars *** times daily, preprandial to breakfast and ***. The patient has *** had hypoglycemic episodes since the last clinic visit, which typically occur *** x / - most often occuring ***. The patient is *** symptomatic with these episodes, with symptoms of {symptoms; hypoglycemia:9084048}. Otherwise, the patient {HAS/HAS NOT:522402} required any recent emergency interventions for hypoglycemia and {HAS/HAS NOT:522402} had recent hospitalizations secondary to hyper or hypoglycemic episodes.    Hx of pancreatitis 11/2020  HOME DIABETES REGIMEN:    Statin:  ACE-I/ARB:  Prior Diabetic Education:    METER DOWNLOAD SUMMARY: Date range evaluated: *** Fingerstick Blood Glucose Tests = *** Average Number Tests/Day = *** Overall Mean FS Glucose = *** Standard Deviation =   BG Ranges: Low =  High = ***   Hypoglycemic Events/30 Days: BG < 50 =  Episodes of symptomatic severe hypoglycemia = ***    DIABETIC COMPLICATIONS: Microvascular complications:   Denies:  Last Eye Exam: Completed    Macrovascular complications:   Denies: CAD, CVA, PVD   HISTORY:  Past Medical History:  Past Medical History:  Diagnosis Date   Depression    Diabetes mellitus without complication ([place])    Past Surgical History:  Past Surgical History:  Procedure Laterality Date   ARTERY REPAIR Left    left side of neck cut by a piece of glass at age 5   Social History:  reports that he has never smoked. He has never used smokeless tobacco. He reports current alcohol use. He reports current drug use. Drug: Marijuana. Family History:  Family History  Problem Relation Age of Onset   Hypertension Mother    Diabetes Mother    Vision loss Mother    Diabetes Father      HOME MEDICATIONS: Allergies as of 02/23/2021   No Known Allergies      Medication List        Accurate as of February 23, 2021 12:39 PM. If you have any questions, ask your nurse or doctor.          gabapentin 300 MG capsule Commonly known as: NEURONTIN Take 1 capsule (300 mg total) by mouth 3 (three) times daily.   glipiZIDE 10 MG tablet Commonly known as: GLUCOTROL Take 1 tablet (10 mg total) by mouth 2 (two) times daily before a meal.   Januvia 100 MG tablet Generic drug: sitaGLIPtin Take 1 tablet (100 mg total) by mouth daily.   lisinopril 20 MG tablet Commonly known as: ZESTRIL Take 1 tablet (20 mg total) by mouth daily.   nabumetone 750  MG tablet Commonly known as: RELAFEN Take 1 tablet (750 mg total) by mouth 2 (two) times daily as needed.   ondansetron 4 MG tablet Commonly known as: ZOFRAN Take 1 tablet (4 mg total) by mouth every 6 (six) hours.   True Metrix Blood Glucose Test test strip Generic drug: glucose blood Use as instructed   True Metrix Meter w/Device Kit Use as directed   TRUEplus Lancets 28G Misc Use as directed         OBJECTIVE:   Vital Signs: There were no vitals taken for this visit.  Wt Readings from Last 3 Encounters:  11/19/20 156 lb 1.6 oz (70.8 kg)   11/17/20 170 lb 2 oz (77.2 kg)  04/16/20 177 lb 6.4 oz (80.5 kg)     Exam: General: Pt appears well and is in NAD  Hydration: Well-hydrated with moist mucous membranes and good skin turgor  HEENT: Head: Unremarkable with good dentition. Oropharynx clear without exudate.  Eyes: External eye exam normal without stare, lid lag or exophthalmos.  EOM intact.  PERRL.  Neck: General: Supple without adenopathy. Thyroid: Thyroid size normal.  No goiter or nodules appreciated. No thyroid bruit.  Lungs: Clear with good BS bilat with no rales, rhonchi, or wheezes  Heart: RRR with normal S1 and S2 and no gallops; no murmurs; no rub  Abdomen: Normoactive bowel sounds, soft, nontender, without masses or organomegaly palpable  Extremities: No pretibial edema. No tremor. Normal strength and motion throughout. See detailed diabetic foot exam below.  Skin: Normal texture and temperature to palpation. No rash noted. No Acanthosis nigricans/skin tags. No lipohypertrophy.  Neuro: MS is good with appropriate affect, pt is alert and Ox3    DM foot exam: Please see diabetic assessment flow-sheet detailed below:           DATA REVIEWED:  Lab Results  Component Value Date   HGBA1C 12.8 (A) 11/17/2020   HGBA1C 8.0 (A) 04/16/2020   HGBA1C >15.5 (H) 01/03/2020   Lab Results  Component Value Date   LDLCALC 84 04/16/2020   CREATININE 0.99 11/19/2020   Lab Results  Component Value Date   MICRALBCREAT <7 01/03/2020     Lab Results  Component Value Date   CHOL 171 04/16/2020   HDL 68 04/16/2020   LDLCALC 84 04/16/2020   TRIG 108 04/16/2020   CHOLHDL 2.5 04/16/2020         ASSESSMENT / PLAN / RECOMMENDATIONS:   1) Type {NUMBERS 1 OR 2:522190} Diabetes Mellitus, ***controlled, With *** complications - Most recent A1c of *** %. Goal A1c < *** %.  ***  Plan: MEDICATIONS:   EDUCATION / INSTRUCTIONS: BG monitoring instructions: Patient is instructed to check his blood sugars *** times a  day, ***. Call [place] [facility] clinic if: BG persistently < 70 or > 300. I reviewed the Rule of 15 for the treatment of hypoglycemia in detail with the patient. Literature supplied.  REFERRALS:    2) Diabetic complications:  Eye: Does *** have known diabetic retinopathy.  Neuro/ Feet: Does *** have known diabetic peripheral neuropathy .  Renal: Patient does *** have known baseline CKD. He   is *** on an ACEI/ARB at present. Check urine albumin/creatinine ratio yearly starting at time of diagnosis. If albuminuria is positive, treatment is geared toward better glucose, blood pressure control and use of ACE inhibitors or ARBs. Monitor electrolytes and creatinine once to twice yearly.   3) Lipids: Patient is *** on a statin.  4) Hypertension: *** at  goal of < 140/90 mmHg.    F/U in ***    Signed electronically by: Mack Guise, MD  [facility] Endocrinology  [facility] [address] [place], [place] [address] Phone: [phone] FAX: [phone]   CC: [place], Melanie Crazier, [place] [address] Phone: [phone]  Fax: [phone]  Return to Endocrinology clinic as below: Future Appointments  Date Time Provider [place]  02/23/2021  1:20 PM Geraldene Eisel, Melanie Crazier, MD [facility] [place]

## 2021-06-05 DIAGNOSIS — E119 Type 2 diabetes mellitus without complications: Secondary | ICD-10-CM | POA: Diagnosis not present

## 2021-06-05 DIAGNOSIS — Z23 Encounter for immunization: Secondary | ICD-10-CM | POA: Diagnosis not present

## 2021-07-11 DIAGNOSIS — H109 Unspecified conjunctivitis: Secondary | ICD-10-CM | POA: Diagnosis not present

## 2021-07-12 ENCOUNTER — Telehealth: Payer: Self-pay

## 2021-07-12 NOTE — Telephone Encounter (Signed)
Transition Care Management Unsuccessful Follow-up Telephone Call  Date of discharge and from where:  07/11/2021 from [facility]  Attempts:  1st Attempt  Reason for unsuccessful TCM follow-up call:  Unable to reach patient    

## 2021-07-13 NOTE — Telephone Encounter (Signed)
Transition Care Management Unsuccessful Follow-up Telephone Call  Date of discharge and from where:  07/11/2021 from [facility]  Attempts:  2nd Attempt  Reason for unsuccessful TCM follow-up call:  Unable to reach patient

## 2021-07-14 NOTE — Telephone Encounter (Signed)
Transition Care Management Unsuccessful Follow-up Telephone Call  Date of discharge and from where:  07/11/2021 from [facility]  Attempts:  3rd Attempt  Reason for unsuccessful TCM follow-up call:  Unable to reach patient

## 2021-08-23 DIAGNOSIS — E1165 Type 2 diabetes mellitus with hyperglycemia: Secondary | ICD-10-CM | POA: Diagnosis not present

## 2021-08-25 ENCOUNTER — Telehealth: Payer: Self-pay

## 2021-08-25 NOTE — Telephone Encounter (Signed)
Transition Care Management Follow-up Telephone Call Date of discharge and from where: 08/24/2021-[facility] How have you been since you were released from the hospital? Patient stated he is doing ok.  Any questions or concerns? No  Items Reviewed: Did the pt receive and understand the discharge instructions provided? Yes  Medications obtained and verified?  No new medications sent to the pharmacy.  Other? No  Any new allergies since your discharge? No  Dietary orders reviewed? No Do you have support at home? Yes   Home Care and Equipment/Supplies: Were home health services ordered? not applicable If so, what is the name of the agency? N/A  Has the agency set up a time to come to the patient's home? not applicable Were any new equipment or medical supplies ordered?  No What is the name of the medical supply agency? N/A Were you able to get the supplies/equipment? not applicable Do you have any questions related to the use of the equipment or supplies? No  Functional Questionnaire: (I = Independent and D = Dependent) ADLs: I  Bathing/Dressing- I  Meal Prep- I  Eating- I  Maintaining continence- I  Transferring/Ambulation- I  Managing Meds- I  Follow up appointments reviewed:  PCP Hospital f/u appt confirmed? No   Specialist Hospital f/u appt confirmed? No   Are transportation arrangements needed? No  If their condition worsens, is the pt aware to call PCP or go to the Emergency Dept.? Yes Was the patient provided with contact information for the PCP's office or ED? Yes Was to pt encouraged to call back with questions or concerns? Yes

## 2021-08-28 DIAGNOSIS — E119 Type 2 diabetes mellitus without complications: Secondary | ICD-10-CM | POA: Diagnosis not present

## 2021-08-28 DIAGNOSIS — Z0000 Encounter for general adult medical examination without abnormal findings: Secondary | ICD-10-CM | POA: Diagnosis not present

## 2021-08-28 DIAGNOSIS — Z125 Encounter for screening for malignant neoplasm of prostate: Secondary | ICD-10-CM | POA: Diagnosis not present

## 2021-08-28 DIAGNOSIS — R5383 Other fatigue: Secondary | ICD-10-CM | POA: Diagnosis not present

## 2021-08-28 DIAGNOSIS — Z6822 Body mass index (BMI) 22.0-22.9, adult: Secondary | ICD-10-CM | POA: Diagnosis not present

## 2021-08-28 DIAGNOSIS — Z1211 Encounter for screening for malignant neoplasm of colon: Secondary | ICD-10-CM | POA: Diagnosis not present

## 2021-09-07 DIAGNOSIS — E1165 Type 2 diabetes mellitus with hyperglycemia: Secondary | ICD-10-CM | POA: Diagnosis not present

## 2021-09-07 DIAGNOSIS — R110 Nausea: Secondary | ICD-10-CM | POA: Diagnosis not present

## 2021-09-07 DIAGNOSIS — R531 Weakness: Secondary | ICD-10-CM | POA: Diagnosis not present

## 2021-09-07 DIAGNOSIS — R609 Edema, unspecified: Secondary | ICD-10-CM | POA: Diagnosis not present

## 2021-09-07 DIAGNOSIS — R739 Hyperglycemia, unspecified: Secondary | ICD-10-CM | POA: Diagnosis not present

## 2021-09-08 ENCOUNTER — Telehealth: Payer: Self-pay

## 2021-09-08 NOTE — Telephone Encounter (Signed)
Transition Care Management Unsuccessful Follow-up Telephone Call  Date of discharge and from where:  09/07/2021 from [facility]  Attempts:  1st Attempt  Reason for unsuccessful TCM follow-up call:  Left voice message

## 2021-09-09 NOTE — Telephone Encounter (Signed)
Transition Care Management Follow-up Telephone Call Date of discharge and from where: 09/07/2021 from [facility] How have you been since you were released from the hospital? Pt stated that he is not feeling quite right. Denies SOB, DOE, CP or dizziness. Pt stated that he is seeing his PCP today to go over medication. Pt stated that is why he is feeling a little off. Pt stated he felt safe and did not have any questions or concerns at this time.  Any questions or concerns? No  Items Reviewed: Did the pt receive and understand the discharge instructions provided? Yes  Medications obtained and verified? Yes  Other? No  Any new allergies since your discharge? No  Dietary orders reviewed? No Do you have support at home? Yes   Functional Questionnaire: (I = Independent and D = Dependent) ADLs: I  Bathing/Dressing- I  Meal Prep- I  Eating- I  Maintaining continence- I  Transferring/Ambulation- I  Managing Meds- I  Follow up appointments reviewed:  PCP Hospital f/u appt confirmed? Yes  Scheduled to see [facility] on 09/09/2021 @ 10:30am. Specialist [facility] f/u appt confirmed? No   Are transportation arrangements needed? No  If their condition worsens, is the pt aware to call PCP or go to the Emergency Dept.? Yes Was the patient provided with contact information for the PCP's office or ED? Yes Was to pt encouraged to call back with questions or concerns? Yes

## 2021-09-11 DIAGNOSIS — E1169 Type 2 diabetes mellitus with other specified complication: Secondary | ICD-10-CM | POA: Diagnosis not present

## 2021-09-11 DIAGNOSIS — E1142 Type 2 diabetes mellitus with diabetic polyneuropathy: Secondary | ICD-10-CM | POA: Diagnosis not present

## 2021-09-11 DIAGNOSIS — E119 Type 2 diabetes mellitus without complications: Secondary | ICD-10-CM | POA: Diagnosis not present

## 2021-09-11 DIAGNOSIS — E782 Mixed hyperlipidemia: Secondary | ICD-10-CM | POA: Diagnosis not present

## 2021-09-11 DIAGNOSIS — I10 Essential (primary) hypertension: Secondary | ICD-10-CM | POA: Diagnosis not present

## 2021-09-11 DIAGNOSIS — Z6822 Body mass index (BMI) 22.0-22.9, adult: Secondary | ICD-10-CM | POA: Diagnosis not present

## 2021-11-10 DIAGNOSIS — E782 Mixed hyperlipidemia: Secondary | ICD-10-CM | POA: Diagnosis not present

## 2021-11-10 DIAGNOSIS — E119 Type 2 diabetes mellitus without complications: Secondary | ICD-10-CM | POA: Diagnosis not present

## 2021-11-10 DIAGNOSIS — E1169 Type 2 diabetes mellitus with other specified complication: Secondary | ICD-10-CM | POA: Diagnosis not present

## 2021-11-10 DIAGNOSIS — I10 Essential (primary) hypertension: Secondary | ICD-10-CM | POA: Diagnosis not present

## 2021-11-10 DIAGNOSIS — Z6823 Body mass index (BMI) 23.0-23.9, adult: Secondary | ICD-10-CM | POA: Diagnosis not present

## 2021-12-22 ENCOUNTER — Other Ambulatory Visit: Payer: Self-pay | Admitting: *Deleted

## 2021-12-22 NOTE — Patient Instructions (Signed)
Dear Mr. Gerst, ? ?Thank you for taking time to speak with me today about care coordination and care management services available to you at no cost as part of your Medicaid benefit. These services are voluntary. Our team is available to provide assistance regarding your health care needs at any time. Please do not hesitate to reach out to me if we can be of service to you at any time in the future.  ? ?[name] RN, BSN ?[place]  [facility] ?RN Care Coordinator ?  ?

## 2021-12-22 NOTE — Patient Outreach (Signed)
Care Coordination ? ?12/22/2021 ? ?[name] ?09-Aug-1961 ?[id number] ? ?Keith Valdez was referred to the [facility] Managed Care High Risk team for assistance with care coordination and care management services. Care coordination/care management services as part of the Medicaid benefit was offered to the patient today. The patient declined assistance offered today.  ? ?Plan: The Medicaid Managed Care High Risk team is available at any time in the future to assist with care coordination/care management services upon referral.  ? ?[name] RN, BSN ?[place]  [facility] ?RN Care Coordinator ? ? ? ?

## 2021-12-24 DIAGNOSIS — R100 Acute abdomen: Secondary | ICD-10-CM | POA: Diagnosis not present

## 2021-12-24 DIAGNOSIS — Z20822 Contact with and (suspected) exposure to covid-19: Secondary | ICD-10-CM | POA: Diagnosis not present

## 2021-12-24 DIAGNOSIS — R197 Diarrhea, unspecified: Secondary | ICD-10-CM | POA: Diagnosis not present

## 2021-12-24 DIAGNOSIS — J101 Influenza due to other identified influenza virus with other respiratory manifestations: Secondary | ICD-10-CM | POA: Diagnosis not present

## 2021-12-24 DIAGNOSIS — R1084 Generalized abdominal pain: Secondary | ICD-10-CM | POA: Diagnosis not present

## 2022-01-26 DIAGNOSIS — E119 Type 2 diabetes mellitus without complications: Secondary | ICD-10-CM | POA: Diagnosis not present

## 2022-01-26 DIAGNOSIS — E782 Mixed hyperlipidemia: Secondary | ICD-10-CM | POA: Diagnosis not present

## 2022-01-26 DIAGNOSIS — E1169 Type 2 diabetes mellitus with other specified complication: Secondary | ICD-10-CM | POA: Diagnosis not present

## 2022-01-26 DIAGNOSIS — I10 Essential (primary) hypertension: Secondary | ICD-10-CM | POA: Diagnosis not present

## 2022-03-28 DIAGNOSIS — R112 Nausea with vomiting, unspecified: Secondary | ICD-10-CM | POA: Diagnosis not present

## 2022-05-02 IMAGING — CT CT ABD-PELV W/ CM
2 of 5 series · 16 of 46 positions shown, 18 images · IV contrast (Omnipaque)
Comparison: None.

CLINICAL DATA: Abdominal distension.

EXAM:
CT ABDOMEN AND PELVIS WITH CONTRAST
TECHNIQUE: Multidetector CT imaging of the abdomen and pelvis was performed
using the standard protocol following bolus administration of
intravenous contrast.
CONTRAST:  100mL OMNIPAQUE IOHEXOL 300 MG/ML  SOLN

[Series 2: axial st · axial · 0.70mm/px · z∈[-659,-274]mm · 13 of 87 slices shown, 15 images]
[im 5/87  soft-tissue]
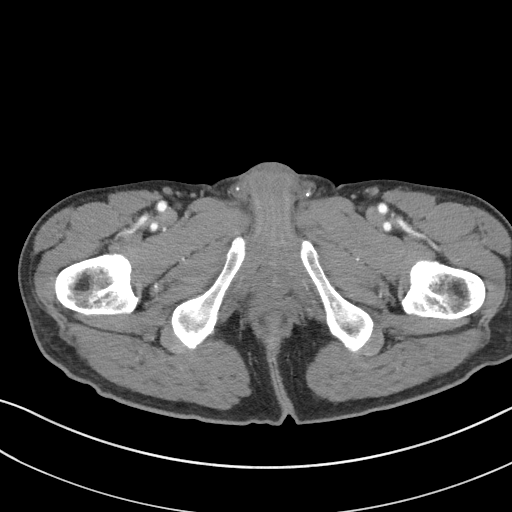
[im 5/87  bone]
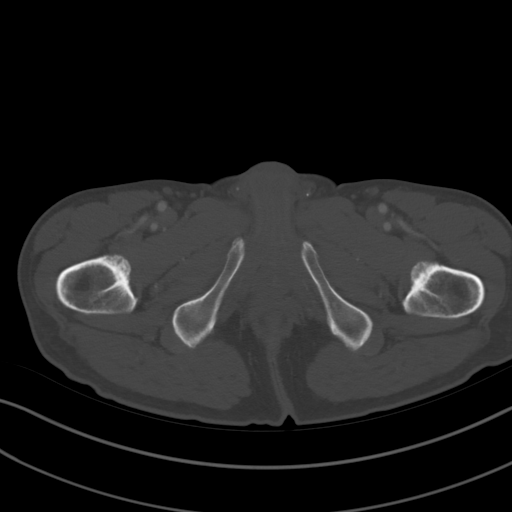
[im 14/87  soft-tissue]
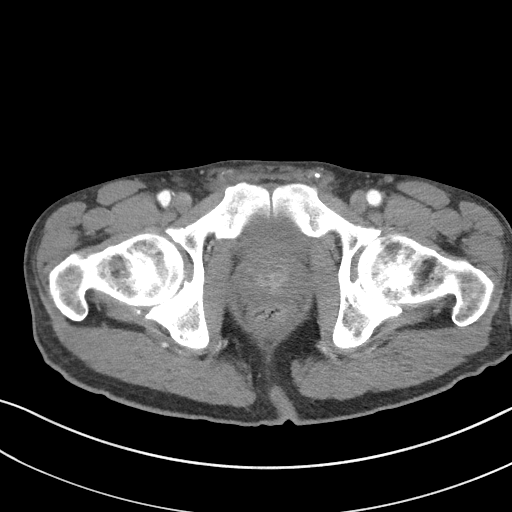
[im 19/87  soft-tissue]
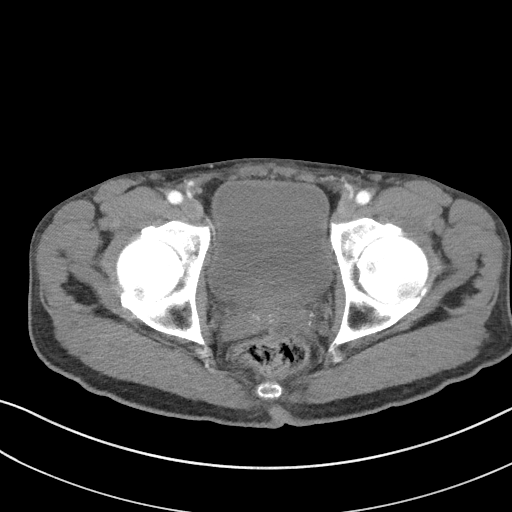
[im 23/87  soft-tissue]
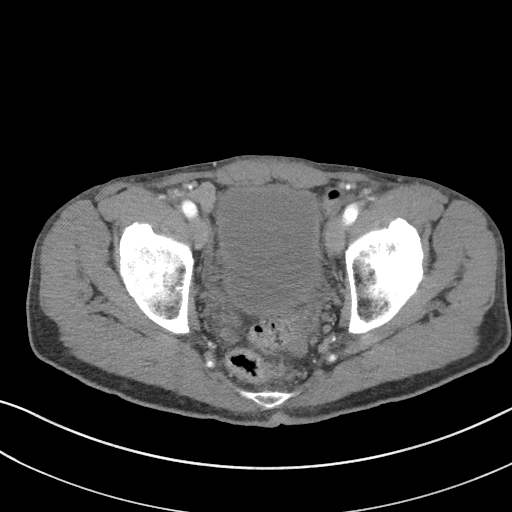
[im 32/87  soft-tissue]
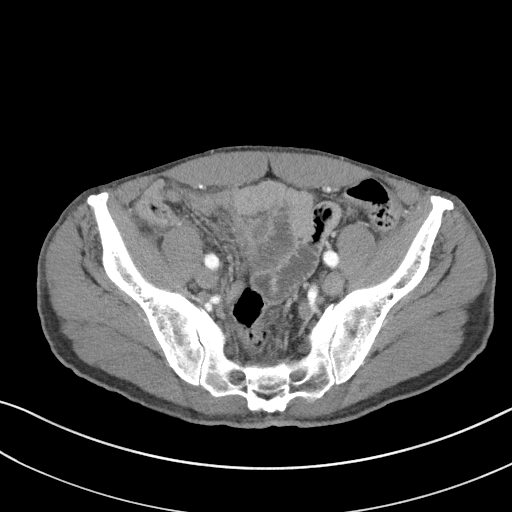
[im 37/87  soft-tissue]
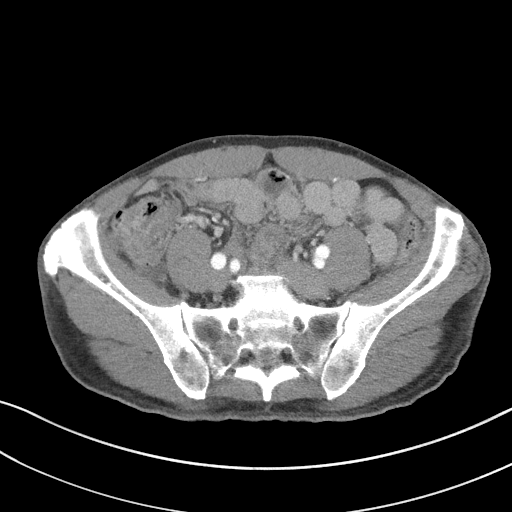
[im 46/87  soft-tissue]
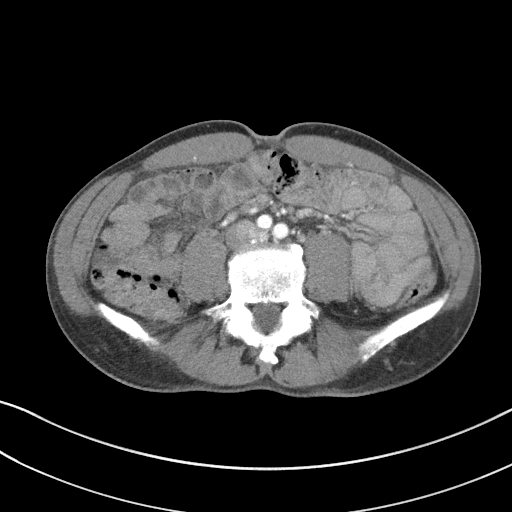
[im 50/87  soft-tissue]
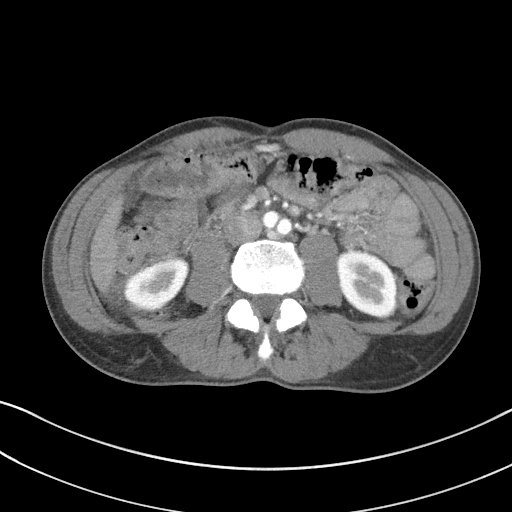
[im 55/87  soft-tissue]
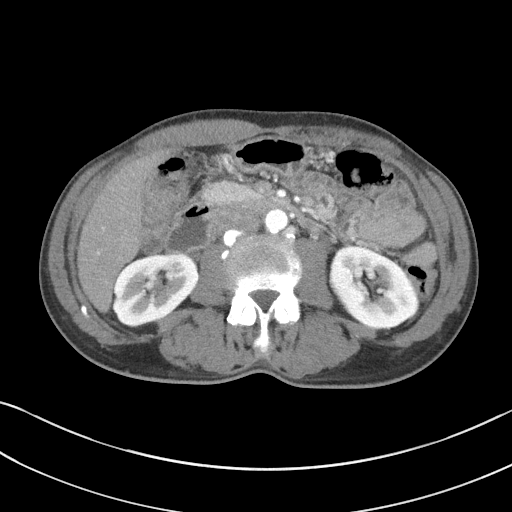
[im 55/87  bone]
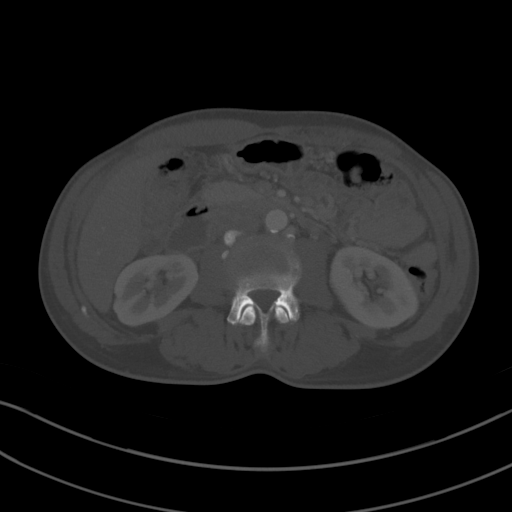
[im 64/87  soft-tissue]
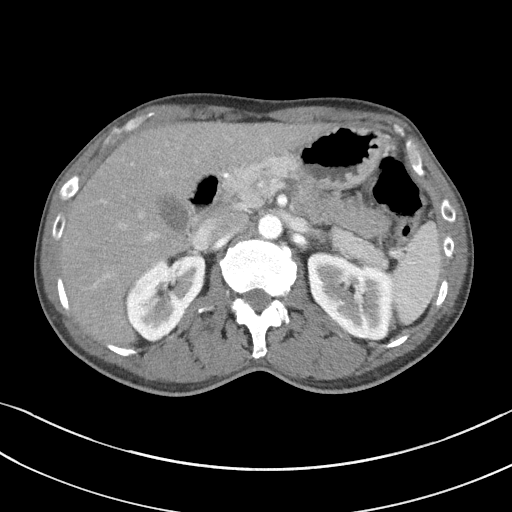
[im 68/87  soft-tissue]
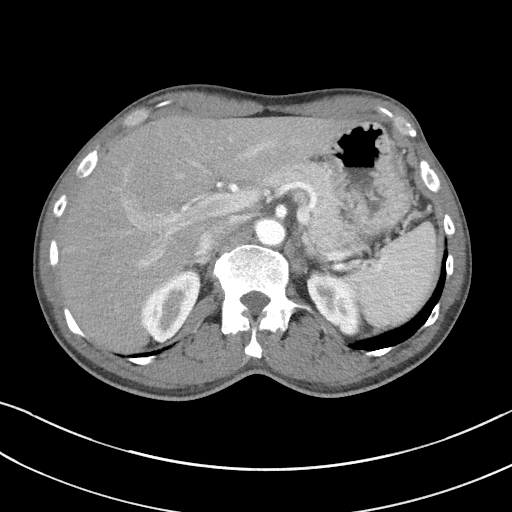
[im 73/87  soft-tissue]
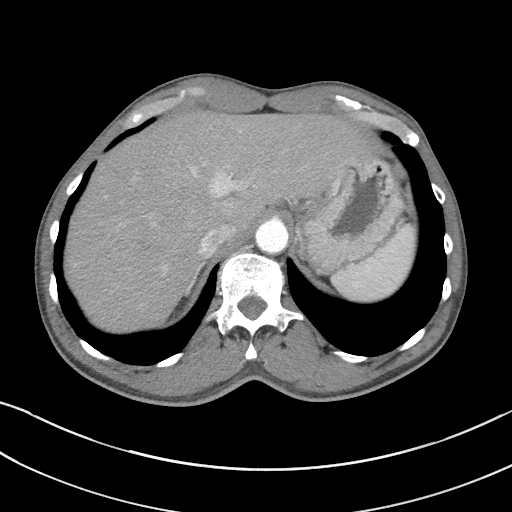
[im 82/87  soft-tissue]
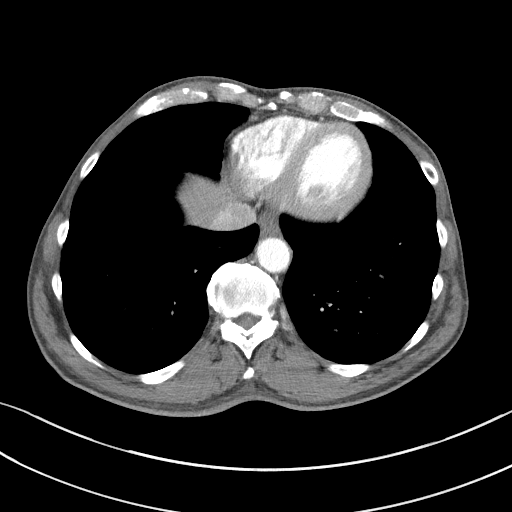

[Series 5: coronal st · coronal · 0.66mm/px · 3 of 101 slices shown]
[im 34/101  soft-tissue]
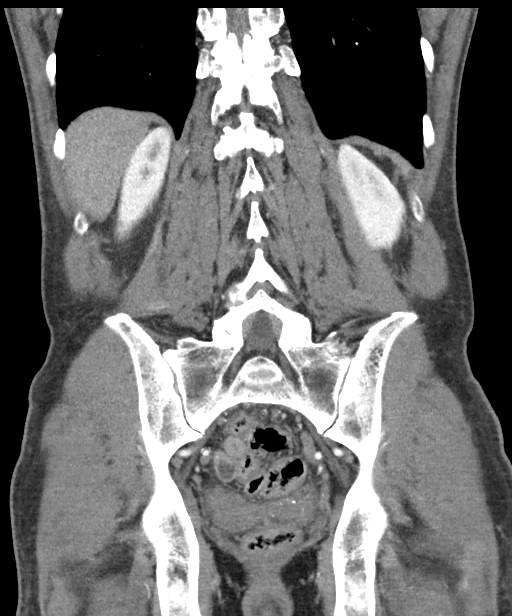
[im 45/101  soft-tissue]
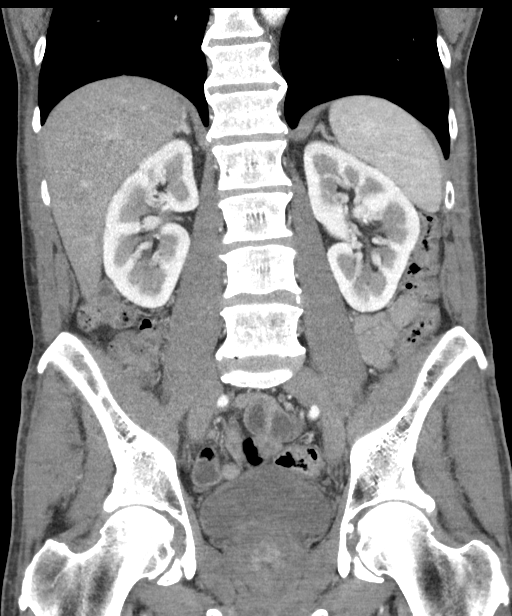
[im 56/101  soft-tissue]
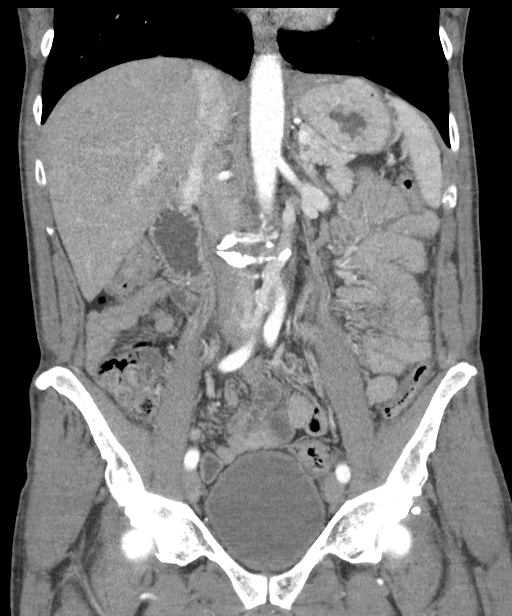

[16 of 46 positions shown; findings below may reference images not displayed]

FINDINGS: Lower chest: No acute abnormality.

Hepatobiliary: There is diffuse fatty infiltration of the liver
parenchyma. No focal liver abnormality is seen. No gallstones,
gallbladder wall thickening, or biliary dilatation.

Pancreas: Unremarkable. No pancreatic ductal dilatation or
surrounding inflammatory changes.

Spleen: Normal in size without focal abnormality.

Adrenals/Urinary Tract: Adrenal glands are unremarkable. Kidneys are
normal, without renal calculi, focal lesion, or hydronephrosis.
Bladder is unremarkable.

Stomach/Bowel: Stomach is within normal limits. The appendix is
poorly visualized. No evidence of bowel dilatation. Diverticula are
seen throughout the large bowel.

Vascular/Lymphatic: Mild aortic atherosclerosis. No enlarged
abdominal or pelvic lymph nodes.

Reproductive: There is mild to moderate severity prostate gland
enlargement.

Other: No abdominal wall hernia or abnormality. No abdominopelvic
ascites.

Musculoskeletal: Multilevel degenerative changes seen throughout the
lumbar spine.
IMPRESSION: 1. Hepatic steatosis.
2. Colonic diverticulosis.
3. Mild to moderate severity prostate gland enlargement.
4. Aortic atherosclerosis.

Aortic Atherosclerosis ([id number]).

## 2022-05-26 DIAGNOSIS — E118 Type 2 diabetes mellitus with unspecified complications: Secondary | ICD-10-CM | POA: Diagnosis not present

## 2022-05-26 DIAGNOSIS — Z6822 Body mass index (BMI) 22.0-22.9, adult: Secondary | ICD-10-CM | POA: Diagnosis not present

## 2022-05-26 DIAGNOSIS — I10 Essential (primary) hypertension: Secondary | ICD-10-CM | POA: Diagnosis not present

## 2022-06-09 DIAGNOSIS — B351 Tinea unguium: Secondary | ICD-10-CM | POA: Diagnosis not present
